# Patient Record
Sex: Female | Born: 1995 | Race: Black or African American | Hispanic: No | Marital: Single
Health system: Southern US, Community
[De-identification: ages and names within clinical notes are randomized; demographics above are authoritative.]

## PROBLEM LIST (undated history)

## (undated) DIAGNOSIS — J45909 Unspecified asthma, uncomplicated: Secondary | ICD-10-CM

## (undated) DIAGNOSIS — F32A Depression, unspecified: Secondary | ICD-10-CM

## (undated) DIAGNOSIS — G4700 Insomnia, unspecified: Secondary | ICD-10-CM

## (undated) DIAGNOSIS — R51 Headache: Secondary | ICD-10-CM

## (undated) DIAGNOSIS — T7840XA Allergy, unspecified, initial encounter: Secondary | ICD-10-CM

## (undated) DIAGNOSIS — F329 Major depressive disorder, single episode, unspecified: Secondary | ICD-10-CM

## (undated) DIAGNOSIS — R519 Headache, unspecified: Secondary | ICD-10-CM

## (undated) HISTORY — PX: WISDOM TOOTH EXTRACTION: SHX21

## (undated) HISTORY — DX: Allergy, unspecified, initial encounter: T78.40XA

## (undated) HISTORY — DX: Unspecified asthma, uncomplicated: J45.909

---
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
## 2016-02-26 ENCOUNTER — Ambulatory Visit (INDEPENDENT_AMBULATORY_CARE_PROVIDER_SITE_OTHER): Payer: Self-pay | Admitting: Orthopaedic Surgery

## 2016-03-27 ENCOUNTER — Ambulatory Visit (INDEPENDENT_AMBULATORY_CARE_PROVIDER_SITE_OTHER): Payer: Medicaid Other | Admitting: Orthopaedic Surgery

## 2016-05-02 ENCOUNTER — Ambulatory Visit (INDEPENDENT_AMBULATORY_CARE_PROVIDER_SITE_OTHER): Payer: Medicaid Other | Admitting: Physician Assistant

## 2016-05-22 ENCOUNTER — Ambulatory Visit (INDEPENDENT_AMBULATORY_CARE_PROVIDER_SITE_OTHER): Payer: Medicaid Other | Admitting: Orthopaedic Surgery

## 2016-06-19 ENCOUNTER — Ambulatory Visit (INDEPENDENT_AMBULATORY_CARE_PROVIDER_SITE_OTHER): Payer: Medicaid Other | Admitting: Orthopaedic Surgery

## 2016-06-24 ENCOUNTER — Encounter (INDEPENDENT_AMBULATORY_CARE_PROVIDER_SITE_OTHER): Payer: Self-pay

## 2016-06-24 ENCOUNTER — Ambulatory Visit (INDEPENDENT_AMBULATORY_CARE_PROVIDER_SITE_OTHER): Payer: Medicaid Other | Admitting: Orthopaedic Surgery

## 2016-06-24 DIAGNOSIS — M5126 Other intervertebral disc displacement, lumbar region: Secondary | ICD-10-CM | POA: Diagnosis not present

## 2016-06-24 NOTE — Progress Notes (Signed)
The patient is actually well known to me. She is a 21 year old who is morbidly obese who we saw last year was an acute herniated disc to the left at L5-S1. She was asked symptomatic at the time and just went to physical therapy. She's been trying to lose weight and still from time to time working on extension exercises. Physical therapist tried traction. She is continuing to have pain in her lower aspect of her lumbar spine but does radiate down her foot. She is not a diabetic.  On examination she has normal motor and sensory function when I examine both right and lower extremities independently. Her reflexes are equal as well. She has a significantly positive straight leg raise though on the the left.  We went over her previous MRI that did show a herniated disc was well contained at L5-S1 it was contacting the S1 nerve.  At this point she does wish to try an intervention such as an epidural steroid injection. We'll see if Dr. Alvester MorinNewton can do this to the left side at L5-S1 and then I will see her back myself in a month and see what this is done for her.

## 2016-06-25 ENCOUNTER — Other Ambulatory Visit (INDEPENDENT_AMBULATORY_CARE_PROVIDER_SITE_OTHER): Payer: Self-pay

## 2016-06-25 DIAGNOSIS — M545 Low back pain: Principal | ICD-10-CM

## 2016-06-25 DIAGNOSIS — G8929 Other chronic pain: Secondary | ICD-10-CM

## 2016-07-03 ENCOUNTER — Telehealth (INDEPENDENT_AMBULATORY_CARE_PROVIDER_SITE_OTHER): Payer: Self-pay | Admitting: Orthopaedic Surgery

## 2016-07-03 ENCOUNTER — Encounter (INDEPENDENT_AMBULATORY_CARE_PROVIDER_SITE_OTHER): Payer: Medicaid Other | Admitting: Physical Medicine and Rehabilitation

## 2016-07-03 NOTE — Telephone Encounter (Signed)
She can have tylenol #3, 1-2 po every 8 hours as needed for pain, #60

## 2016-07-03 NOTE — Telephone Encounter (Signed)
Please advise 

## 2016-07-04 ENCOUNTER — Telehealth (INDEPENDENT_AMBULATORY_CARE_PROVIDER_SITE_OTHER): Payer: Self-pay | Admitting: Orthopaedic Surgery

## 2016-07-04 NOTE — Telephone Encounter (Signed)
Rx called in to pharmacy. 

## 2016-07-10 NOTE — Telephone Encounter (Signed)
Routed call to Dr. Eliberto Ivory assistant.

## 2016-07-16 ENCOUNTER — Encounter (INDEPENDENT_AMBULATORY_CARE_PROVIDER_SITE_OTHER): Payer: Medicaid Other | Admitting: Physical Medicine and Rehabilitation

## 2016-07-22 ENCOUNTER — Ambulatory Visit (INDEPENDENT_AMBULATORY_CARE_PROVIDER_SITE_OTHER): Payer: Medicaid Other | Admitting: Orthopaedic Surgery

## 2016-08-07 ENCOUNTER — Encounter (INDEPENDENT_AMBULATORY_CARE_PROVIDER_SITE_OTHER): Payer: Self-pay | Admitting: Physical Medicine and Rehabilitation

## 2016-08-07 ENCOUNTER — Ambulatory Visit (INDEPENDENT_AMBULATORY_CARE_PROVIDER_SITE_OTHER): Payer: Medicaid Other | Admitting: Physical Medicine and Rehabilitation

## 2016-08-07 ENCOUNTER — Ambulatory Visit (INDEPENDENT_AMBULATORY_CARE_PROVIDER_SITE_OTHER): Payer: Self-pay

## 2016-08-07 VITALS — BP 135/79 | HR 107

## 2016-08-07 DIAGNOSIS — M5416 Radiculopathy, lumbar region: Secondary | ICD-10-CM | POA: Diagnosis not present

## 2016-08-07 DIAGNOSIS — M5116 Intervertebral disc disorders with radiculopathy, lumbar region: Secondary | ICD-10-CM

## 2016-08-07 MED ORDER — LIDOCAINE HCL (PF) 1 % IJ SOLN
0.3300 mL | Freq: Once | INTRAMUSCULAR | Status: AC
  Administered 2016-08-07: 0.3 mL

## 2016-08-07 MED ORDER — METHYLPREDNISOLONE ACETATE 80 MG/ML IJ SUSP
80.0000 mg | Freq: Once | INTRAMUSCULAR | Status: AC
  Administered 2016-08-07: 80 mg

## 2016-08-07 NOTE — Progress Notes (Signed)
Anna Nixon - 21 y.o. female MRN [id number]  Date of birth: Sep 16, 1995  Office Visit Note: Visit Date: 08/07/2016 PCP: [facility] Referred by: [facility]  Subjective: Chief Complaint  Patient presents with  . Lower Back - Pain   HPI: Anna Nixon is a pleasant 21 year old female who comes in today for planned left S1 transforaminal epidural steroid injection for radiculitis and radiculopathy related to L5-S1 left posterior lateral disc herniation. Patient comes in with a (10/10) pain level. Pain is contstant. Pain and numbness when sitting or standing for too long. She is somewhat anxious today with the thought of an injection in her spine. We did discuss this at length and she should do well and did do well as you'll see below. She's had other conservative care without much relief.    ROS Otherwise per HPI.  Assessment & Plan: Visit Diagnoses:  1. Lumbar radiculopathy   2. Radiculopathy due to lumbar intervertebral disc disorder     Plan: Findings:  Left S1 transforaminal epidural steroid injection. Patient did tolerate the procedure very well. She is a morbidly obese but I'll female. She would do well with core strengthening neutral spine as well as weight loss. Typically these disc herniations do resolve over time and they'll be intermittent flareups and we did discuss this with her. This should not be anything that limits her the rest of her life.    Meds & Orders:  Meds ordered this encounter  Medications  . lidocaine (PF) (XYLOCAINE) 1 % injection 0.3 mL  . methylPREDNISolone acetate (DEPO-MEDROL) injection 80 mg    Orders Placed This Encounter  Procedures  . XR C-ARM NO REPORT  . Epidural Steroid injection    Follow-up: Return in about 3 weeks (around 08/28/2016) for Dr. Magnus Ivan.   Procedures: No procedures performed  S1 Lumbosacral Transforaminal Epidural Steroid Injection - Sub-Pedicular Approach with Fluoroscopic Guidance     Patient: Anna Nixon      Date of Birth: 05-Mar-1996 MRN: [id number] PCP: [facility]      Visit Date: 08/07/2016   Universal Protocol:    Date/Time: 03/30/186:44 AM  Consent Given By: the patient  Position:  PRONE  Additional Comments: Vital signs were monitored before and after the procedure. Patient was prepped and draped in the usual sterile fashion. The correct patient, procedure, and site was verified.   Injection Procedure Details:  Procedure Site One Meds Administered:  Meds ordered this encounter  Medications  . lidocaine (PF) (XYLOCAINE) 1 % injection 0.3 mL  . methylPREDNISolone acetate (DEPO-MEDROL) injection 80 mg    Laterality: Left  Location/Site:  S1 Foramen   Needle size: 22 G  Needle type: Spinal  Needle Placement: Transforaminal  Findings:  -Contrast Used: 1 mL iohexol 180 mg iodine/mL   -Comments: Excellent flow of contrast along the nerve and into the epidural space.  Procedure Details: After squaring off the sacral end-plate to get a true AP view, the C-arm was positioned so that the best possible view of the S1 foramen was visualized. The soft tissues overlying this structure were infiltrated with 2-3 ml. of 1% Lidocaine without Epinephrine.    The spinal needle was inserted toward the target using a "trajectory" view along the fluoroscope beam.  Under AP and lateral visualization, the needle was advanced so it did not puncture dura. Biplanar projections were used to confirm position. Aspiration was confirmed to be negative for CSF and/or blood. A 1-2  ml. volume of Isovue-250 was injected and flow of contrast was noted at each level. Radiographs were obtained for documentation purposes.   After attaining the desired flow of contrast documented above, a 0.5 to 1.0 ml test dose of 0.25% Marcaine was injected into each respective transforaminal space.  The patient was observed for 90 seconds post injection.  After no  sensory deficits were reported, and normal lower extremity motor function was noted,   the above injectate was administered so that equal amounts of the injectate were placed at each foramen (level) into the transforaminal epidural space.   Additional Comments:  The patient tolerated the procedure well Dressing: Band-Aid    Post-procedure details: Patient was observed during the procedure. Post-procedure instructions were reviewed.  Patient left the clinic in stable condition.     Clinical History: Lumbar Spine MRI 09/22/2015 FINDINGS: There is no abnormality at L4-5 or above. The discs are normal. The canal and foramina are widely patent. The distal cord and conus are normal with the conus tip at upper L1.   L5-S1: Left posterior lateral disc herniation displaces and compresses the left S1 nerve root. No migration of the disc.   IMPRESSION: Left posterior lateral disc herniation at L5-S1 compressing and displacing the left S1 nerve root.  She reports that she is a non-smoker but has been exposed to tobacco smoke. She has never used smokeless tobacco. No results for input(s): HGBA1C, LABURIC in the last 8760 hours.  Objective:  VS:  HT:    WT:   BMI:     BP:135/79  HR:(!) 107bpm  TEMP: ( )  RESP:98 % Physical Exam  Musculoskeletal:  Patient ambulates without aid. She has good distal strength with dorsiflexion plantarflexion EHL. She has a positive slump test to the left.    Ortho Exam Imaging: No results found.  Past Medical/Family/Surgical/Social History: Medications & Allergies reviewed per EMR There are no active problems to display for this patient.  Past Medical History:  Diagnosis Date  . Allergy   . Asthma    Family History  Problem Relation Age of Onset  . Hyperlipidemia Mother   . Hypertension Mother   . Diabetes Other   . Hypertension Maternal Grandmother   . Hyperlipidemia Maternal Grandmother    Past Surgical History:  Procedure Laterality  Date  . WISDOM TOOTH EXTRACTION     Social History   Occupational History  . Not on file.   Social History Main Topics  . Smoking status: Passive Smoke Exposure - Never Smoker  . Smokeless tobacco: Never Used  . Alcohol use No  . Drug use: No  . Sexual activity: Not on file

## 2016-08-07 NOTE — Patient Instructions (Signed)

## 2016-08-09 NOTE — Procedures (Signed)
S1 Lumbosacral Transforaminal Epidural Steroid Injection - Sub-Pedicular Approach with Fluoroscopic Guidance   Patient: Anna Nixon      Date of Birth: May 19, 1995 MRN: [id number] PCP: [facility]      Visit Date: 08/07/2016   Universal Protocol:    Date/Time: 03/30/186:44 AM  Consent Given By: the patient  Position:  PRONE  Additional Comments: Vital signs were monitored before and after the procedure. Patient was prepped and draped in the usual sterile fashion. The correct patient, procedure, and site was verified.   Injection Procedure Details:  Procedure Site One Meds Administered:  Meds ordered this encounter  Medications  . lidocaine (PF) (XYLOCAINE) 1 % injection 0.3 mL  . methylPREDNISolone acetate (DEPO-MEDROL) injection 80 mg    Laterality: Left  Location/Site:  S1 Foramen   Needle size: 22 G  Needle type: Spinal  Needle Placement: Transforaminal  Findings:  -Contrast Used: 1 mL iohexol 180 mg iodine/mL   -Comments: Excellent flow of contrast along the nerve and into the epidural space.  Procedure Details: After squaring off the sacral end-plate to get a true AP view, the C-arm was positioned so that the best possible view of the S1 foramen was visualized. The soft tissues overlying this structure were infiltrated with 2-3 ml. of 1% Lidocaine without Epinephrine.    The spinal needle was inserted toward the target using a "trajectory" view along the fluoroscope beam.  Under AP and lateral visualization, the needle was advanced so it did not puncture dura. Biplanar projections were used to confirm position. Aspiration was confirmed to be negative for CSF and/or blood. A 1-2 ml. volume of Isovue-250 was injected and flow of contrast was noted at each level. Radiographs were obtained for documentation purposes.   After attaining the desired flow of contrast documented above, a 0.5 to 1.0 ml test dose of 0.25% Marcaine was injected  into each respective transforaminal space.  The patient was observed for 90 seconds post injection.  After no sensory deficits were reported, and normal lower extremity motor function was noted,   the above injectate was administered so that equal amounts of the injectate were placed at each foramen (level) into the transforaminal epidural space.   Additional Comments:  The patient tolerated the procedure well Dressing: Band-Aid    Post-procedure details: Patient was observed during the procedure. Post-procedure instructions were reviewed.  Patient left the clinic in stable condition.

## 2016-08-19 ENCOUNTER — Telehealth (INDEPENDENT_AMBULATORY_CARE_PROVIDER_SITE_OTHER): Payer: Self-pay | Admitting: Orthopaedic Surgery

## 2016-08-19 NOTE — Telephone Encounter (Signed)
Patient's mother called asked if her daughter can be worked in sooner with Dr Magnus Ivan. She said her daughter is in so much pain and want to know what to do. She advised the medication is not working to stop the pain. The number to contact Unk Pinto is [phone]

## 2016-08-20 NOTE — Telephone Encounter (Signed)
Patient rescheduled 08/22/16 at 1:00 pm with Suszanne Finch mother of appointment

## 2016-08-20 NOTE — Telephone Encounter (Signed)
We could maybe work her in with [facility] Thursday? But it will be a Workin, so she will wait

## 2016-08-22 ENCOUNTER — Ambulatory Visit (INDEPENDENT_AMBULATORY_CARE_PROVIDER_SITE_OTHER): Payer: Medicaid Other | Admitting: Physician Assistant

## 2016-08-22 DIAGNOSIS — M5416 Radiculopathy, lumbar region: Secondary | ICD-10-CM

## 2016-08-22 MED ORDER — DICLOFENAC SODIUM 75 MG PO TBEC: 75.0000 mg | DELAYED_RELEASE_TABLET | Freq: Two times a day (BID) | ORAL | 1 refills | Status: DC

## 2016-08-22 NOTE — Progress Notes (Signed)
   Office Visit Note   Patient: Anna Nixon           Date of Birth: 1995-11-27           MRN: [id number] Visit Date: 08/22/2016              Requested by: [facility] [address] [place], [place] [address] PCP: [facility]   Assessment & Plan: Visit Diagnoses:  1. Radiculopathy, lumbar region     Plan: We will have her stop her naproxen. Place her on diclofenac 75 mg twice a day. We'll refer her to neurosurgery for evaluation and treatment. She'll follow with [place] on when necessary basis.  Follow-Up Instructions: Return if symptoms worsen or fail to improve.   Orders:  No orders of the defined types were placed in this encounter.  Meds ordered this encounter  Medications  . diclofenac (VOLTAREN) 75 MG EC tablet    Sig: Take 1 tablet (75 mg total) by mouth 2 (two) times daily.    Dispense:  60 tablet    Refill:  1      Procedures: No procedures performed   Clinical Data: No additional findings.   Subjective: No chief complaint on file.   HPI Anna Nixon turns today for follow-up of her low back pain with radicular symptoms left leg status post S1 lumbosacral transforaminal epidural steroid injection by Dr. Alvester Morin. She states the injection was somewhat helpful but she still a lot of pain. She's unable to sit or stand for any period of time due to the pain. She continues to have radicular symptoms down the left leg. Review of Systems   Objective: Vital Signs: There were no vitals taken for this visit.  Physical Exam  Constitutional: She is oriented to person, place, and time. She appears well-developed and well-nourished. She appears distressed.  Pulmonary/Chest: Effort normal.  Neurological: She is alert and oriented to person, place, and time.  Psychiatric: She has a normal mood and affect.    Ortho Exam  Positive straight leg raise on the left negative on the right. Walks with an antalgic gait  without any assistive device. Specialty Comments:  No specialty comments available.  Imaging: No results found.   PMFS History: There are no active problems to display for this patient.  Past Medical History:  Diagnosis Date  . Allergy   . Asthma     Family History  Problem Relation Age of Onset  . Hyperlipidemia Mother   . Hypertension Mother   . Diabetes Other   . Hypertension Maternal Grandmother   . Hyperlipidemia Maternal Grandmother     Past Surgical History:  Procedure Laterality Date  . WISDOM TOOTH EXTRACTION     Social History   Occupational History  . Not on file.   Social History Main Topics  . Smoking status: Passive Smoke Exposure - Never Smoker  . Smokeless tobacco: Never Used  . Alcohol use No  . Drug use: No  . Sexual activity: Not on file

## 2016-08-26 ENCOUNTER — Ambulatory Visit (INDEPENDENT_AMBULATORY_CARE_PROVIDER_SITE_OTHER): Payer: Medicaid Other | Admitting: Orthopaedic Surgery

## 2016-10-30 ENCOUNTER — Other Ambulatory Visit: Payer: Self-pay | Admitting: Physician Assistant

## 2016-10-30 DIAGNOSIS — M5126 Other intervertebral disc displacement, lumbar region: Secondary | ICD-10-CM

## 2016-11-05 ENCOUNTER — Emergency Department (HOSPITAL_COMMUNITY): Payer: Medicaid Other

## 2016-11-05 ENCOUNTER — Emergency Department (HOSPITAL_COMMUNITY)
Admission: EM | Admit: 2016-11-05 | Discharge: 2016-11-05 | Disposition: A | Discharge status: Home / Self Care | Payer: Medicaid Other | Attending: Emergency Medicine | Admitting: Emergency Medicine

## 2016-11-05 ENCOUNTER — Encounter (HOSPITAL_COMMUNITY): Payer: Self-pay | Admitting: Emergency Medicine

## 2016-11-05 DIAGNOSIS — Z7722 Contact with and (suspected) exposure to environmental tobacco smoke (acute) (chronic): Secondary | ICD-10-CM | POA: Insufficient documentation

## 2016-11-05 DIAGNOSIS — M5432 Sciatica, left side: Secondary | ICD-10-CM | POA: Diagnosis present

## 2016-11-05 DIAGNOSIS — J45909 Unspecified asthma, uncomplicated: Secondary | ICD-10-CM | POA: Diagnosis not present

## 2016-11-05 MED ORDER — KETOROLAC TROMETHAMINE 60 MG/2ML IM SOLN
60.0000 mg | Freq: Once | INTRAMUSCULAR | Status: AC
  Administered 2016-11-05: 60 mg via INTRAMUSCULAR
  Filled 2016-11-05: qty 2

## 2016-11-05 MED ORDER — BACLOFEN 10 MG PO TABS: 10.0000 mg | ORAL_TABLET | Freq: Three times a day (TID) | ORAL | 0 refills | Status: DC

## 2016-11-05 MED ORDER — PREDNISONE 20 MG PO TABS: 40.0000 mg | ORAL_TABLET | Freq: Every day | ORAL | 0 refills | Status: DC

## 2016-11-05 MED ORDER — MELOXICAM 15 MG PO TABS: 15.0000 mg | ORAL_TABLET | Freq: Every day | ORAL | 0 refills | Status: DC

## 2016-11-05 MED ORDER — DEXAMETHASONE SODIUM PHOSPHATE 10 MG/ML IJ SOLN
10.0000 mg | Freq: Once | INTRAMUSCULAR | Status: AC
  Administered 2016-11-05: 10 mg via INTRAMUSCULAR
  Filled 2016-11-05: qty 1

## 2016-11-05 NOTE — ED Notes (Signed)
Pt ambulatory and independent at discharge.  Verbalized understanding of discharge instructions 

## 2016-11-05 NOTE — ED Provider Notes (Signed)
[facility]-EMERGENCY DEPT Provider Note   CSN: [id number] Arrival date & time: 11/05/16  1250     History   Chief Complaint Chief Complaint  Patient presents with  . Back Pain    HPI Anna Nixon is a 21 y.o. female who presents emergency Department with chief complaint of left-sided sciatica. She has a possible history of obesity and confirmed left-sided S1 compression on MRI. She was seen previously by Dr. Magnus Ivan and had a direct steroid injection by Dr. Alvester Morin at his office. She had minimal relief of her symptoms and has had continued severe pain for  Several months. She states that her pain is currently 10 out of 10. She has been trying to rest and has had no relief. She states that no movement seemed to make it any worse or better. She is constant, severe pain radiating down the left leg. She denies any new numbness, weakness, saddle anesthesia or urinary or fecal incontinence. She states that she was given steroid medication previously by Dr. Magnus Ivan and that seemed to help her the most..  HPI  Past Medical History:  Diagnosis Date  . Allergy   . Asthma     There are no active problems to display for this patient.   Past Surgical History:  Procedure Laterality Date  . WISDOM TOOTH EXTRACTION      OB History    No data available       Home Medications    Prior to Admission medications   Medication Sig Start Date End Date Taking? Authorizing Provider  cloNIDine (CATAPRES) 0.1 MG tablet Take 0.1 mg by mouth 2 (two) times daily.    [provider]  diclofenac (VOLTAREN) 75 MG EC tablet Take 1 tablet (75 mg total) by mouth 2 (two) times daily. 08/22/16   Kirtland Bouchard, PA-C  methocarbamol (ROBAXIN) 500 MG tablet Take 1 tablet (500 mg total) by mouth 4 (four) times daily. 05/18/15   Renne Crigler, PA-C    Family History Family History  Problem Relation Age of Onset  . Hyperlipidemia Mother   . Hypertension Mother   . Diabetes Other   . Hypertension  Maternal Grandmother   . Hyperlipidemia Maternal Grandmother     Social History Social History  Substance Use Topics  . Smoking status: Passive Smoke Exposure - Never Smoker  . Smokeless tobacco: Never Used  . Alcohol use No     Allergies   Patient has no known allergies.   Review of Systems Review of Systems  Ten systems reviewed and are negative for acute change, except as noted in the HPI.   Physical Exam Updated Vital Signs BP (!) 169/139   Pulse 100   Temp 99.3 F (37.4 C) (Oral)   Resp 20   Wt (!) 151 kg (333 lb)   LMP 10/29/2016   SpO2 100%   BMI 50.63 kg/m   Physical Exam  Constitutional: She is oriented to person, place, and time. She appears well-developed and well-nourished. No distress.  Patient unable to lie still in the bed twisting and turning. She appears uncomfortable  HENT:  Head: Normocephalic and atraumatic.  Eyes: Conjunctivae are normal. No scleral icterus.  Neck: Normal range of motion.  Cardiovascular: Normal rate, regular rhythm and normal heart sounds.  Exam reveals no gallop and no friction rub.   No murmur heard. Pulmonary/Chest: Effort normal and breath sounds normal. No respiratory distress.  Abdominal: Soft. Bowel sounds are normal. She exhibits no distension and no mass. There is  no tenderness. There is no guarding.  Musculoskeletal:  Patient appears to be in mild to moderate pain, antalgic gait noted. Lumbosacral spine area reveals no local tenderness or mass. Painful and reduced LS ROM noted. Straight leg raise is positive at 10 degrees on left. DTR's, motor strength and sensation normal, including heel and toe gait.  Peripheral pulses are palpable.   Neurological: She is alert and oriented to person, place, and time.  Skin: Skin is warm and dry. She is not diaphoretic.  Psychiatric: Her behavior is normal.  Nursing note and vitals reviewed.    ED Treatments / Results  Labs (all labs ordered are listed, but only abnormal  results are displayed) Labs Reviewed - No data to display  EKG  EKG Interpretation None       Radiology Dg Chest 2 View  Result Date: 11/05/2016 CLINICAL DATA:  Mid back pain for several days, history of asthma EXAM: CHEST  2 VIEW COMPARISON:  Chest x-ray of 06/12/2015 FINDINGS: No active infiltrate or effusion is seen. Mediastinal and hilar contours are unremarkable. Heart is within normal limits in size. No bony abnormality is noted. IMPRESSION: No active cardiopulmonary disease. Electronically Signed   By: Dwyane DeePaul  Barry M.D.   On: 11/05/2016 14:32    Procedures Procedures (including critical care time)  Medications Ordered in ED Medications - No data to display   Initial Impression / Assessment and Plan / ED Course  I have reviewed the triage vital signs and the nursing notes.  Pertinent labs & imaging results that were available during my care of the patient were reviewed by me and considered in my medical decision making (see chart for details).  Clinical Course as of Nov 06 1751  Tue Nov 05, 2016  1659 Patient appears to be in pain. She appears to be wearing an inappropriately sized. BP: (!) 169/139 [AH]    Clinical Course User Index [AH] Arthor CaptainHarris, Haydin Calandra, PA-C   Patient with back pain.  No neurological deficits and normal neuro exam.  Patient can walk but states is painful.  No loss of bowel or bladder control.  No concern for cauda equina.  No fever, night sweats, weight loss, h/o cancer, IVDU.  RICE protocol and pain medicine indicated and discussed with patient.    Final Clinical Impressions(s) / ED Diagnoses   Final diagnoses:  Sciatica of left side    New Prescriptions New Prescriptions   No medications on file     Arthor CaptainHarris, Donnajean Chesnut, PA-C 11/05/16 1753    Loren RacerYelverton, David, MD 11/08/16 1640

## 2016-11-05 NOTE — ED Triage Notes (Signed)
Per patient, states mid back pain that started a few days ago-states it hurt to breath-states she has a herniated disc

## 2016-11-05 NOTE — Discharge Instructions (Signed)
SEEK IMMEDIATE MEDICAL ATTENTION IF: New numbness, tingling, weakness, or problem with the use of your arms or legs.  Severe back pain not relieved with medications.  Change in bowel or bladder control.  Increasing pain in any areas of the body (such as chest or abdominal pain).  Shortness of breath, dizziness or fainting.  Nausea (feeling sick to your stomach), vomiting, fever, or sweats.  

## 2016-11-05 NOTE — ED Notes (Signed)
ED Provider at bedside. 

## 2016-11-21 ENCOUNTER — Ambulatory Visit
Admission: RE | Admit: 2016-11-21 | Discharge: 2016-11-21 | Disposition: A | Discharge status: Home / Self Care | Payer: Medicaid Other | Source: Ambulatory Visit | Attending: Physician Assistant | Admitting: Physician Assistant

## 2016-11-21 DIAGNOSIS — M5126 Other intervertebral disc displacement, lumbar region: Secondary | ICD-10-CM

## 2017-03-26 ENCOUNTER — Other Ambulatory Visit: Payer: Self-pay | Admitting: Neurosurgery

## 2017-03-31 ENCOUNTER — Encounter (HOSPITAL_COMMUNITY): Payer: Self-pay

## 2017-03-31 ENCOUNTER — Encounter (HOSPITAL_COMMUNITY): Payer: Self-pay | Admitting: Emergency Medicine

## 2017-03-31 ENCOUNTER — Other Ambulatory Visit: Payer: Self-pay

## 2017-03-31 ENCOUNTER — Encounter (HOSPITAL_COMMUNITY)
Admission: RE | Admit: 2017-03-31 | Discharge: 2017-03-31 | Disposition: A | Discharge status: Home / Self Care | Payer: Medicaid Other | Source: Ambulatory Visit | Attending: Neurosurgery | Admitting: Neurosurgery

## 2017-03-31 DIAGNOSIS — M5432 Sciatica, left side: Secondary | ICD-10-CM | POA: Insufficient documentation

## 2017-03-31 DIAGNOSIS — Z01818 Encounter for other preprocedural examination: Secondary | ICD-10-CM | POA: Insufficient documentation

## 2017-03-31 HISTORY — DX: Headache, unspecified: R51.9

## 2017-03-31 HISTORY — DX: Insomnia, unspecified: G47.00

## 2017-03-31 HISTORY — DX: Headache: R51

## 2017-03-31 LAB — CBC
HEMATOCRIT: 36.3 % (ref 36.0–46.0)
HEMOGLOBIN: 11.6 g/dL — AB (ref 12.0–15.0)
MCH: 22.5 pg — AB (ref 26.0–34.0)
MCHC: 32 g/dL (ref 30.0–36.0)
MCV: 70.5 fL — ABNORMAL LOW (ref 78.0–100.0)
Platelets: 661 10*3/uL — ABNORMAL HIGH (ref 150–400)
RBC: 5.15 MIL/uL — AB (ref 3.87–5.11)
RDW: 17.7 % — ABNORMAL HIGH (ref 11.5–15.5)
WBC: 10.3 10*3/uL (ref 4.0–10.5)

## 2017-03-31 LAB — BASIC METABOLIC PANEL
ANION GAP: 8 (ref 5–15)
BUN: 7 mg/dL (ref 6–20)
CALCIUM: 9 mg/dL (ref 8.9–10.3)
CO2: 24 mmol/L (ref 22–32)
Chloride: 106 mmol/L (ref 101–111)
Creatinine, Ser: 0.53 mg/dL (ref 0.44–1.00)
GFR calc non Af Amer: >60 mL/min (ref >60)
GLUCOSE: 105 mg/dL — AB (ref 65–99)
POTASSIUM: 3.7 mmol/L (ref 3.5–5.1)
Sodium: 138 mmol/L (ref 135–145)

## 2017-03-31 LAB — HCG, SERUM, QUALITATIVE: PREG SERUM: NEGATIVE

## 2017-03-31 NOTE — Progress Notes (Signed)
PCP - pt denies having a PCP Cardiologist - pt denies cardiac hx or cardiologist, takes clonidine at night for insomnia  Patient denies shortness of breath, fever, cough and chest pain at PAT appointment  Patient verbalized understanding of instructions that were given to them at the PAT appointment. Patient was also instructed that they will need to review over the PAT instructions again at home before surgery.

## 2017-03-31 NOTE — Pre-Procedure Instructions (Signed)
Anna Nixon  03/31/2017      [id number] [address], [place] - [phone] [facility] [address] [place] [id number] Phone: [phone] Fax: [phone]    Your procedure is scheduled on Tuesday Novmber 27.  [facility].M[facility]  Call this number if you have problems the morning of surgery:  [phone]   Remember:  Do not eat food or drink liquids after midnight.  Take these medicines the morning of surgery with A SIP OF WATER:   Albuterol inhaler Alprazolam (Xanax) beclomethasone (QVAR) Bupropion (wellbutrin) Cetirizine (zyrtec) Clonidine (catapres)  7 days prior to surgery STOP taking any Aspirin(unless otherwise instructed by your surgeon), Aleve, Naproxen, Ibuprofen, Motrin, Advil, Goody's, BC's, all herbal medications, fish oil, and all vitamins    Do not wear jewelry, make-up or nail polish.  Do not wear lotions, powders, or perfumes, or deoderant.  Do not shave 48 hours prior to surgery.  Men may shave face and neck.  Do not bring valuables to the hospital.  [facility] is not responsible for any belongings or valuables.  Contacts, dentures or bridgework may not be worn into surgery.  Leave your suitcase in the car.  After surgery it may be brought to your room.  For patients admitted to the hospital, discharge time will be determined by your treatment team.  Patients discharged the day of surgery will not be allowed to drive home.   Special instructions:    [facility]- Preparing For Surgery  Before surgery, you can play an important role. Because skin is not sterile, your skin needs to be as free of germs as possible. You can reduce the number of germs on your skin by washing with CHG (chlorahexidine gluconate) Soap before surgery.  CHG is an antiseptic cleaner which kills germs and bonds with the skin to continue killing germs even after washing.  Please do not use  if you have an allergy to CHG or antibacterial soaps. If your skin becomes reddened/irritated stop using the CHG.  Do not shave (including legs and underarms) for at least 48 hours prior to first CHG shower. It is OK to shave your face.  Please follow these instructions carefully.   1. Shower the NIGHT BEFORE SURGERY and the MORNING OF SURGERY with CHG.   2. If you chose to wash your hair, wash your hair first as usual with your normal shampoo.  3. After you shampoo, rinse your hair and body thoroughly to remove the shampoo.  4. Use CHG as you would any other liquid soap. You can apply CHG directly to the skin and wash gently with a scrungie or a clean washcloth.   5. Apply the CHG Soap to your body ONLY FROM THE NECK DOWN.  Do not use on open wounds or open sores. Avoid contact with your eyes, ears, mouth and genitals (private parts). Wash Face and genitals (private parts)  with your normal soap.  6. Wash thoroughly, paying special attention to the area where your surgery will be performed.  7. Thoroughly rinse your body with warm water from the neck down.  8. DO NOT shower/wash with your normal soap after using and rinsing off the CHG Soap.  9. Pat yourself dry with a CLEAN TOWEL.  10. Wear CLEAN PAJAMAS to bed the night before surgery, wear comfortable clothes the morning of surgery  11. Place CLEAN SHEETS on your bed the night of  your first shower and DO NOT SLEEP WITH PETS.    Day of Surgery: Do not apply any deodorants/lotions. Please wear clean clothes to the hospital/surgery center.      Please read over the following fact sheets that you were given. Coughing and Deep Breathing, MRSA Information and Surgical Site Infection Prevention

## 2017-04-02 LAB — NASOPHARYNGEAL CULTURE: Culture: NORMAL

## 2017-04-08 ENCOUNTER — Encounter (HOSPITAL_COMMUNITY): Admission: RE | Payer: Self-pay | Source: Ambulatory Visit

## 2017-04-08 ENCOUNTER — Ambulatory Visit (HOSPITAL_COMMUNITY): Admission: RE | Admit: 2017-04-08 | Payer: Medicaid Other | Source: Ambulatory Visit | Admitting: Neurosurgery

## 2017-04-08 SURGERY — LUMBAR LAMINECTOMY/DECOMPRESSION MICRODISCECTOMY 1 LEVEL
Anesthesia: General | Laterality: Left

## 2017-04-24 ENCOUNTER — Other Ambulatory Visit: Payer: Self-pay | Admitting: Neurosurgery

## 2017-05-05 ENCOUNTER — Other Ambulatory Visit (HOSPITAL_COMMUNITY): Payer: Medicaid Other

## 2017-05-08 NOTE — Progress Notes (Addendum)
PCP:  Cardiologist:  EKG:  Stress test:  ECHO:  Cardiac Cath:  Chest x-ray 

## 2017-05-08 NOTE — Pre-Procedure Instructions (Signed)
Anna Nixon  05/08/2017      [id number] [address]ette Otto- [place], [place] - [id number] [facility] [address] [place] [address] Phone: [phone] Fax: [phone]    Your procedure is scheduled on May 15, 2017.  [facility].  Call this number if you have problems the morning of surgery:  [phone]   Remember:  Do not eat food or drink liquids after midnight.  Take these medicines the morning of surgery with A SIP OF WATER QVAR inhaler, bupropion (wellbutrin).  7 days prior to surgery STOP taking any Aspirin (unless otherwise instructed by your surgeon), Aleve, Naproxen, Ibuprofen, Motrin, Advil, Goody's, BC's, all herbal medications, fish oil, and all vitamins  Continue all other medications as instructed by your physician except follow the above medication instructions before surgery   Do not wear jewelry, make-up or nail polish.  Do not wear lotions, powders, or perfumes, or deodorant.  Do not shave 48 hours prior to surgery.  Men may shave face and neck.  Do not bring valuables to the hospital.  [facility] is not responsible for any belongings or valuables.  Contacts, dentures or bridgework may not be worn into surgery.  Leave your suitcase in the car.  After surgery it may be brought to your room.  For patients admitted to the hospital, discharge time will be determined by your treatment team.  Patients discharged the day of surgery will not be allowed to drive home.   Special instructions:  [name]  Before surgery, you can play an important role. Because skin is not sterile, your skin needs to be as free of germs as possible. You can reduce the number of germs on your skin by washing with CHG (chlorahexidine gluconate) Soap before surgery.  CHG is an antiseptic cleaner which kills germs and bonds with the skin to continue killing germs even after  washing.  Please do not use if you have an allergy to CHG or antibacterial soaps. If your skin becomes reddened/irritated stop using the CHG.  Do not shave (including legs and underarms) for at least 48 hours prior to first CHG shower. It is OK to shave your face.  Please follow these instructions carefully.   1. Shower the NIGHT BEFORE SURGERY and the MORNING OF SURGERY with CHG.   2. If you chose to wash your hair, wash your hair first as usual with your normal shampoo.  3. After you shampoo, rinse your hair and body thoroughly to remove the shampoo.  4. Use CHG as you would any other liquid soap. You can apply CHG directly to the skin and wash gently with a scrungie or a clean washcloth.   5. Apply the CHG Soap to your body ONLY FROM THE NECK DOWN.  Do not use on open wounds or open sores. Avoid contact with your eyes, ears, mouth and genitals (private parts). Wash Face and genitals (private parts)  with your normal soap.  6. Wash thoroughly, paying special attention to the area where your surgery will be performed.  7. Thoroughly rinse your body with warm water from the neck down.  8. DO NOT shower/wash with your normal soap after using and rinsing off the CHG Soap.  9. Pat yourself dry with a CLEAN TOWEL.  10. Wear CLEAN PAJAMAS to bed the night before surgery, wear comfortable clothes the morning of surgery  11. Place CLEAN SHEETS  on your bed the night of your first shower and DO NOT SLEEP WITH PETS.  Day of Surgery: Do not apply any deodorants/lotions. Please wear clean clothes to the hospital/surgery center.    Please read over the following fact sheets that you were given. Pain Booklet, Coughing and Deep Breathing, MRSA Information and Surgical Site Infection Prevention

## 2017-05-09 ENCOUNTER — Inpatient Hospital Stay (HOSPITAL_COMMUNITY)
Admission: RE | Admit: 2017-05-09 | Discharge: 2017-05-09 | Disposition: A | Discharge status: Home / Self Care | Payer: Medicaid Other | Source: Ambulatory Visit

## 2017-05-12 ENCOUNTER — Encounter (HOSPITAL_COMMUNITY): Payer: Self-pay

## 2017-05-12 ENCOUNTER — Other Ambulatory Visit: Payer: Self-pay

## 2017-05-12 ENCOUNTER — Encounter (HOSPITAL_COMMUNITY)
Admission: RE | Admit: 2017-05-12 | Discharge: 2017-05-12 | Disposition: A | Discharge status: Home / Self Care | Payer: Medicaid Other | Source: Ambulatory Visit | Attending: Neurosurgery | Admitting: Neurosurgery

## 2017-05-12 DIAGNOSIS — Z01812 Encounter for preprocedural laboratory examination: Secondary | ICD-10-CM | POA: Diagnosis present

## 2017-05-12 HISTORY — DX: Major depressive disorder, single episode, unspecified: F32.9

## 2017-05-12 HISTORY — DX: Depression, unspecified: F32.A

## 2017-05-12 LAB — CBC
HCT: 35.1 % — ABNORMAL LOW (ref 36.0–46.0)
Hemoglobin: 11.3 g/dL — ABNORMAL LOW (ref 12.0–15.0)
MCH: 22.9 pg — AB (ref 26.0–34.0)
MCHC: 32.2 g/dL (ref 30.0–36.0)
MCV: 71.1 fL — AB (ref 78.0–100.0)
PLATELETS: 608 10*3/uL — AB (ref 150–400)
RBC: 4.94 MIL/uL (ref 3.87–5.11)
RDW: 18.4 % — AB (ref 11.5–15.5)
WBC: 10.8 10*3/uL — AB (ref 4.0–10.5)

## 2017-05-12 LAB — HCG, SERUM, QUALITATIVE: Preg, Serum: NEGATIVE

## 2017-05-12 NOTE — Progress Notes (Signed)
PCP is [facility] Denies ever seeing a cardiologist.  Denies ever having a card cath, stress test, or echo. Denies chest pain, fever, or cough.

## 2017-05-14 LAB — NASOPHARYNGEAL CULTURE: CULTURE: NORMAL

## 2017-05-14 MED ORDER — DEXTROSE 5 % IV SOLN
3.0000 g | INTRAVENOUS | Status: AC
  Administered 2017-05-15: 3 g via INTRAVENOUS
  Filled 2017-05-14: qty 3

## 2017-05-15 ENCOUNTER — Encounter (HOSPITAL_COMMUNITY)
Admission: RE | Disposition: A | Discharge status: Home / Self Care | Payer: Self-pay | Source: Ambulatory Visit | Attending: Neurosurgery

## 2017-05-15 ENCOUNTER — Ambulatory Visit (HOSPITAL_COMMUNITY): Payer: Medicaid Other | Admitting: Anesthesiology

## 2017-05-15 ENCOUNTER — Observation Stay (HOSPITAL_COMMUNITY)
Admission: RE | Admit: 2017-05-15 | Discharge: 2017-05-15 | Disposition: A | Discharge status: Home / Self Care | Payer: Medicaid Other | Source: Ambulatory Visit | Attending: Neurosurgery | Admitting: Neurosurgery

## 2017-05-15 ENCOUNTER — Other Ambulatory Visit: Payer: Self-pay

## 2017-05-15 ENCOUNTER — Encounter (HOSPITAL_COMMUNITY): Payer: Self-pay | Admitting: Anesthesiology

## 2017-05-15 ENCOUNTER — Ambulatory Visit (HOSPITAL_COMMUNITY): Payer: Medicaid Other

## 2017-05-15 DIAGNOSIS — M5126 Other intervertebral disc displacement, lumbar region: Secondary | ICD-10-CM | POA: Diagnosis present

## 2017-05-15 DIAGNOSIS — Z79899 Other long term (current) drug therapy: Secondary | ICD-10-CM | POA: Diagnosis not present

## 2017-05-15 DIAGNOSIS — F329 Major depressive disorder, single episode, unspecified: Secondary | ICD-10-CM | POA: Insufficient documentation

## 2017-05-15 DIAGNOSIS — Z419 Encounter for procedure for purposes other than remedying health state, unspecified: Secondary | ICD-10-CM

## 2017-05-15 DIAGNOSIS — Z6841 Body Mass Index (BMI) 40.0 and over, adult: Secondary | ICD-10-CM | POA: Diagnosis not present

## 2017-05-15 DIAGNOSIS — G4700 Insomnia, unspecified: Secondary | ICD-10-CM | POA: Diagnosis not present

## 2017-05-15 DIAGNOSIS — M5116 Intervertebral disc disorders with radiculopathy, lumbar region: Secondary | ICD-10-CM | POA: Diagnosis not present

## 2017-05-15 HISTORY — PX: LUMBAR LAMINECTOMY/DECOMPRESSION MICRODISCECTOMY: SHX5026

## 2017-05-15 SURGERY — LUMBAR LAMINECTOMY/DECOMPRESSION MICRODISCECTOMY 1 LEVEL
Anesthesia: General | Site: Back

## 2017-05-15 MED ORDER — HYDROMORPHONE HCL 1 MG/ML IJ SOLN: 1.0000 mg | INTRAMUSCULAR | Status: DC | PRN

## 2017-05-15 MED ORDER — LIDOCAINE-EPINEPHRINE 1 %-1:100000 IJ SOLN
INTRAMUSCULAR | Status: DC | PRN
  Administered 2017-05-15: 3 mL

## 2017-05-15 MED ORDER — NEOSTIGMINE METHYLSULFATE 5 MG/5ML IV SOSY
PREFILLED_SYRINGE | INTRAVENOUS | Status: AC
  Filled 2017-05-15: qty 5

## 2017-05-15 MED ORDER — SODIUM CHLORIDE 0.9% FLUSH: 3.0000 mL | Freq: Two times a day (BID) | INTRAVENOUS | Status: DC

## 2017-05-15 MED ORDER — PANTOPRAZOLE SODIUM 40 MG IV SOLR: 40.0000 mg | Freq: Every day | INTRAVENOUS | Status: DC

## 2017-05-15 MED ORDER — METHOCARBAMOL 1000 MG/10ML IJ SOLN
500.0000 mg | Freq: Four times a day (QID) | INTRAVENOUS | Status: DC | PRN
  Filled 2017-05-15: qty 5

## 2017-05-15 MED ORDER — ONDANSETRON HCL 4 MG/2ML IJ SOLN
INTRAMUSCULAR | Status: AC
  Filled 2017-05-15: qty 2

## 2017-05-15 MED ORDER — OXYCODONE HCL 10 MG PO TABS: 10.0000 mg | ORAL_TABLET | ORAL | 0 refills | Status: AC | PRN

## 2017-05-15 MED ORDER — MIDAZOLAM HCL 2 MG/2ML IJ SOLN
INTRAMUSCULAR | Status: AC
  Filled 2017-05-15: qty 2

## 2017-05-15 MED ORDER — FENTANYL CITRATE (PF) 100 MCG/2ML IJ SOLN
INTRAMUSCULAR | Status: DC | PRN
  Administered 2017-05-15: 150 ug via INTRAVENOUS
  Administered 2017-05-15: 100 ug via INTRAVENOUS

## 2017-05-15 MED ORDER — ACETAMINOPHEN 325 MG PO TABS: 650.0000 mg | ORAL_TABLET | ORAL | Status: DC | PRN

## 2017-05-15 MED ORDER — METHOCARBAMOL 500 MG PO TABS
ORAL_TABLET | ORAL | Status: AC
  Filled 2017-05-15: qty 1

## 2017-05-15 MED ORDER — METHOCARBAMOL 1000 MG/10ML IJ SOLN: 500.0000 mg | Freq: Four times a day (QID) | INTRAVENOUS | Status: DC | PRN

## 2017-05-15 MED ORDER — SUCCINYLCHOLINE CHLORIDE 20 MG/ML IJ SOLN
INTRAMUSCULAR | Status: DC | PRN
  Administered 2017-05-15: 120 mg via INTRAVENOUS

## 2017-05-15 MED ORDER — OXYCODONE HCL 5 MG/5ML PO SOLN: 5.0000 mg | Freq: Once | ORAL | Status: DC | PRN

## 2017-05-15 MED ORDER — LACTATED RINGERS IV SOLN
INTRAVENOUS | Status: DC
  Administered 2017-05-15 (×2): via INTRAVENOUS

## 2017-05-15 MED ORDER — ZOLPIDEM TARTRATE 5 MG PO TABS: 5.0000 mg | ORAL_TABLET | Freq: Every evening | ORAL | Status: DC | PRN

## 2017-05-15 MED ORDER — SODIUM CHLORIDE 0.9 % IV SOLN: INTRAVENOUS | Status: DC

## 2017-05-15 MED ORDER — OXYCODONE HCL 5 MG PO TABS: 10.0000 mg | ORAL_TABLET | ORAL | Status: DC | PRN

## 2017-05-15 MED ORDER — BUPROPION HCL ER (XL) 300 MG PO TB24
300.0000 mg | ORAL_TABLET | Freq: Every day | ORAL | Status: DC
  Administered 2017-05-15: 300 mg via ORAL
  Filled 2017-05-15: qty 1

## 2017-05-15 MED ORDER — CHLORHEXIDINE GLUCONATE CLOTH 2 % EX PADS: 6.0000 | MEDICATED_PAD | Freq: Once | CUTANEOUS | Status: DC

## 2017-05-15 MED ORDER — SODIUM CHLORIDE 0.9 % IR SOLN
Status: DC | PRN
  Administered 2017-05-15: 500 mL

## 2017-05-15 MED ORDER — LIDOCAINE 2% (20 MG/ML) 5 ML SYRINGE
INTRAMUSCULAR | Status: AC
  Filled 2017-05-15: qty 5

## 2017-05-15 MED ORDER — METHOCARBAMOL 500 MG PO TABS: 500.0000 mg | ORAL_TABLET | Freq: Three times a day (TID) | ORAL | 0 refills | Status: AC | PRN

## 2017-05-15 MED ORDER — PHENOL 1.4 % MT LIQD: 1.0000 | OROMUCOSAL | Status: DC | PRN

## 2017-05-15 MED ORDER — ACETAMINOPHEN 650 MG RE SUPP: 650.0000 mg | RECTAL | Status: DC | PRN

## 2017-05-15 MED ORDER — OXYCODONE HCL 5 MG PO TABS: 5.0000 mg | ORAL_TABLET | Freq: Once | ORAL | Status: DC | PRN

## 2017-05-15 MED ORDER — ESMOLOL HCL 100 MG/10ML IV SOLN
INTRAVENOUS | Status: DC | PRN
  Administered 2017-05-15 (×2): 20 mg via INTRAVENOUS

## 2017-05-15 MED ORDER — METHYLPREDNISOLONE ACETATE 80 MG/ML IJ SUSP
INTRAMUSCULAR | Status: AC
  Filled 2017-05-15: qty 1

## 2017-05-15 MED ORDER — METHYLPREDNISOLONE ACETATE 80 MG/ML IJ SUSP
INTRAMUSCULAR | Status: DC | PRN
  Administered 2017-05-15: 80 mg

## 2017-05-15 MED ORDER — MENTHOL 3 MG MT LOZG: 1.0000 | LOZENGE | OROMUCOSAL | Status: DC | PRN

## 2017-05-15 MED ORDER — LIDOCAINE-EPINEPHRINE 1 %-1:100000 IJ SOLN
INTRAMUSCULAR | Status: AC
  Filled 2017-05-15: qty 1

## 2017-05-15 MED ORDER — THROMBIN (RECOMBINANT) 5000 UNITS EX SOLR
CUTANEOUS | Status: AC
  Filled 2017-05-15: qty 10000

## 2017-05-15 MED ORDER — ROCURONIUM BROMIDE 100 MG/10ML IV SOLN
INTRAVENOUS | Status: DC | PRN
  Administered 2017-05-15: 20 mg via INTRAVENOUS
  Administered 2017-05-15: 60 mg via INTRAVENOUS

## 2017-05-15 MED ORDER — FENTANYL CITRATE (PF) 250 MCG/5ML IJ SOLN
INTRAMUSCULAR | Status: AC
  Filled 2017-05-15: qty 5

## 2017-05-15 MED ORDER — DOCUSATE SODIUM 100 MG PO CAPS: 100.0000 mg | ORAL_CAPSULE | Freq: Two times a day (BID) | ORAL | Status: DC

## 2017-05-15 MED ORDER — SODIUM CHLORIDE 0.9 % IV SOLN: 250.0000 mL | INTRAVENOUS | Status: DC

## 2017-05-15 MED ORDER — PROPOFOL 10 MG/ML IV BOLUS
INTRAVENOUS | Status: AC
  Filled 2017-05-15: qty 20

## 2017-05-15 MED ORDER — ONDANSETRON HCL 4 MG PO TABS: 4.0000 mg | ORAL_TABLET | Freq: Four times a day (QID) | ORAL | Status: DC | PRN

## 2017-05-15 MED ORDER — HYDROMORPHONE HCL 1 MG/ML IJ SOLN
INTRAMUSCULAR | Status: AC
  Filled 2017-05-15: qty 1

## 2017-05-15 MED ORDER — BISACODYL 10 MG RE SUPP: 10.0000 mg | Freq: Every day | RECTAL | Status: DC | PRN

## 2017-05-15 MED ORDER — ONDANSETRON HCL 4 MG/2ML IJ SOLN: 4.0000 mg | Freq: Four times a day (QID) | INTRAMUSCULAR | Status: DC | PRN

## 2017-05-15 MED ORDER — ACETAMINOPHEN 500 MG PO TABS
1000.0000 mg | ORAL_TABLET | Freq: Four times a day (QID) | ORAL | Status: DC
  Administered 2017-05-15: 1000 mg via ORAL
  Filled 2017-05-15: qty 2

## 2017-05-15 MED ORDER — SENNOSIDES-DOCUSATE SODIUM 8.6-50 MG PO TABS: 1.0000 | ORAL_TABLET | Freq: Every evening | ORAL | Status: DC | PRN

## 2017-05-15 MED ORDER — DEXMEDETOMIDINE HCL IN NACL 200 MCG/50ML IV SOLN
INTRAVENOUS | Status: AC
  Filled 2017-05-15: qty 50

## 2017-05-15 MED ORDER — CLONIDINE HCL 0.1 MG PO TABS: 0.2000 mg | ORAL_TABLET | Freq: Every day | ORAL | Status: DC

## 2017-05-15 MED ORDER — SODIUM CHLORIDE 0.9% FLUSH: 3.0000 mL | INTRAVENOUS | Status: DC | PRN

## 2017-05-15 MED ORDER — METHOCARBAMOL 500 MG PO TABS
500.0000 mg | ORAL_TABLET | Freq: Four times a day (QID) | ORAL | Status: DC | PRN
  Administered 2017-05-15: 500 mg via ORAL

## 2017-05-15 MED ORDER — HYDROMORPHONE HCL 1 MG/ML IJ SOLN
0.2500 mg | INTRAMUSCULAR | Status: DC | PRN
  Administered 2017-05-15: 0.5 mg via INTRAVENOUS

## 2017-05-15 MED ORDER — GABAPENTIN 300 MG PO CAPS: 300.0000 mg | ORAL_CAPSULE | Freq: Three times a day (TID) | ORAL | Status: DC

## 2017-05-15 MED ORDER — BUPIVACAINE HCL (PF) 0.5 % IJ SOLN
INTRAMUSCULAR | Status: DC | PRN
  Administered 2017-05-15: 3 mL

## 2017-05-15 MED ORDER — THROMBIN (RECOMBINANT) 5000 UNITS EX SOLR
OROMUCOSAL | Status: DC | PRN
  Administered 2017-05-15: 5 mL via TOPICAL

## 2017-05-15 MED ORDER — ROCURONIUM BROMIDE 10 MG/ML (PF) SYRINGE
PREFILLED_SYRINGE | INTRAVENOUS | Status: AC
  Filled 2017-05-15: qty 5

## 2017-05-15 MED ORDER — FLEET ENEMA 7-19 GM/118ML RE ENEM: 1.0000 | ENEMA | Freq: Once | RECTAL | Status: DC | PRN

## 2017-05-15 MED ORDER — METHOCARBAMOL 500 MG PO TABS: 500.0000 mg | ORAL_TABLET | Freq: Four times a day (QID) | ORAL | Status: DC | PRN

## 2017-05-15 MED ORDER — CEFAZOLIN SODIUM-DEXTROSE 2-4 GM/100ML-% IV SOLN
2.0000 g | Freq: Three times a day (TID) | INTRAVENOUS | Status: DC
  Administered 2017-05-15: 2 g via INTRAVENOUS
  Filled 2017-05-15: qty 100

## 2017-05-15 MED ORDER — OXYCODONE HCL 5 MG PO TABS
ORAL_TABLET | ORAL | Status: AC
  Filled 2017-05-15: qty 2

## 2017-05-15 MED ORDER — DEXAMETHASONE SODIUM PHOSPHATE 4 MG/ML IJ SOLN
INTRAMUSCULAR | Status: DC | PRN
  Administered 2017-05-15: 10 mg via INTRAVENOUS

## 2017-05-15 MED ORDER — HYDROCODONE-ACETAMINOPHEN 5-325 MG PO TABS: 1.0000 | ORAL_TABLET | ORAL | Status: DC | PRN

## 2017-05-15 MED ORDER — MIDAZOLAM HCL 5 MG/5ML IJ SOLN
INTRAMUSCULAR | Status: DC | PRN
  Administered 2017-05-15: 2 mg via INTRAVENOUS

## 2017-05-15 MED ORDER — DEXAMETHASONE SODIUM PHOSPHATE 10 MG/ML IJ SOLN
INTRAMUSCULAR | Status: AC
  Filled 2017-05-15: qty 1

## 2017-05-15 MED ORDER — OXYCODONE HCL 5 MG PO TABS
10.0000 mg | ORAL_TABLET | ORAL | Status: DC | PRN
  Administered 2017-05-15 (×2): 10 mg via ORAL
  Filled 2017-05-15: qty 2

## 2017-05-15 MED ORDER — TRAZODONE HCL 100 MG PO TABS
100.0000 mg | ORAL_TABLET | Freq: Every day | ORAL | Status: DC
  Filled 2017-05-15: qty 1

## 2017-05-15 MED ORDER — BUPIVACAINE HCL (PF) 0.5 % IJ SOLN
INTRAMUSCULAR | Status: AC
  Filled 2017-05-15: qty 30

## 2017-05-15 MED ORDER — SENNA 8.6 MG PO TABS: 1.0000 | ORAL_TABLET | Freq: Two times a day (BID) | ORAL | Status: DC

## 2017-05-15 MED ORDER — PHENYLEPHRINE 40 MCG/ML (10ML) SYRINGE FOR IV PUSH (FOR BLOOD PRESSURE SUPPORT)
PREFILLED_SYRINGE | INTRAVENOUS | Status: AC
  Filled 2017-05-15: qty 10

## 2017-05-15 MED ORDER — ALUM & MAG HYDROXIDE-SIMETH 200-200-20 MG/5ML PO SUSP: 30.0000 mL | Freq: Four times a day (QID) | ORAL | Status: DC | PRN

## 2017-05-15 MED ORDER — ONDANSETRON HCL 4 MG/2ML IJ SOLN
INTRAMUSCULAR | Status: DC | PRN
  Administered 2017-05-15: 4 mg via INTRAVENOUS

## 2017-05-15 MED ORDER — BUDESONIDE 0.25 MG/2ML IN SUSP
0.2500 mg | Freq: Two times a day (BID) | RESPIRATORY_TRACT | Status: DC
  Filled 2017-05-15 (×2): qty 2

## 2017-05-15 MED ORDER — LIDOCAINE 2% (20 MG/ML) 5 ML SYRINGE
INTRAMUSCULAR | Status: DC | PRN
  Administered 2017-05-15: 60 mg via INTRAVENOUS

## 2017-05-15 MED ORDER — GLYCOPYRROLATE 0.2 MG/ML IJ SOLN
INTRAMUSCULAR | Status: DC | PRN
  Administered 2017-05-15: .8 mg via INTRAVENOUS

## 2017-05-15 MED ORDER — NEOSTIGMINE METHYLSULFATE 10 MG/10ML IV SOLN
INTRAVENOUS | Status: DC | PRN
  Administered 2017-05-15: 5 mg via INTRAVENOUS

## 2017-05-15 MED ORDER — OXYCODONE HCL 5 MG PO TABS: 5.0000 mg | ORAL_TABLET | ORAL | Status: DC | PRN

## 2017-05-15 MED ORDER — 0.9 % SODIUM CHLORIDE (POUR BTL) OPTIME
TOPICAL | Status: DC | PRN
  Administered 2017-05-15: 1000 mL

## 2017-05-15 MED ORDER — PROPOFOL 10 MG/ML IV BOLUS
INTRAVENOUS | Status: DC | PRN
  Administered 2017-05-15: 200 mg via INTRAVENOUS

## 2017-05-15 MED ORDER — CEFAZOLIN SODIUM-DEXTROSE 2-4 GM/100ML-% IV SOLN: 2.0000 g | Freq: Three times a day (TID) | INTRAVENOUS | Status: DC

## 2017-05-15 SURGICAL SUPPLY — 60 items
ADH SKN CLS APL DERMABOND .7 (GAUZE/BANDAGES/DRESSINGS) ×1
APL SKNCLS STERI-STRIP NONHPOA (GAUZE/BANDAGES/DRESSINGS)
BAG DECANTER FOR FLEXI CONT (MISCELLANEOUS) ×3 IMPLANT
BENZOIN TINCTURE PRP APPL 2/3 (GAUZE/BANDAGES/DRESSINGS) IMPLANT
BLADE CLIPPER SURG (BLADE) IMPLANT
BLADE SURG 11 STRL SS (BLADE) ×3 IMPLANT
BUR MATCHSTICK NEURO 3.0 LAGG (BURR) ×3 IMPLANT
CANISTER SUCT 3000ML PPV (MISCELLANEOUS) ×3 IMPLANT
CARTRIDGE OIL MAESTRO DRILL (MISCELLANEOUS) ×1 IMPLANT
CLOSURE WOUND 1/2 X4 (GAUZE/BANDAGES/DRESSINGS)
DECANTER SPIKE VIAL GLASS SM (MISCELLANEOUS) ×3 IMPLANT
DERMABOND ADVANCED (GAUZE/BANDAGES/DRESSINGS) ×2
DERMABOND ADVANCED .7 DNX12 (GAUZE/BANDAGES/DRESSINGS) ×1 IMPLANT
DIFFUSER DRILL AIR PNEUMATIC (MISCELLANEOUS) ×3 IMPLANT
DRAPE LAPAROTOMY 100X72X124 (DRAPES) ×3 IMPLANT
DRAPE MICROSCOPE LEICA (MISCELLANEOUS) ×3 IMPLANT
DRAPE POUCH INSTRU U-SHP 10X18 (DRAPES) ×3 IMPLANT
DRAPE SURG 17X23 STRL (DRAPES) ×3 IMPLANT
DRSG OPSITE POSTOP 3X4 (GAUZE/BANDAGES/DRESSINGS) ×3 IMPLANT
DURAPREP 26ML APPLICATOR (WOUND CARE) ×3 IMPLANT
ELECT REM PT RETURN 9FT ADLT (ELECTROSURGICAL) ×3
ELECTRODE REM PT RTRN 9FT ADLT (ELECTROSURGICAL) ×1 IMPLANT
GAUZE SPONGE 4X4 12PLY STRL (GAUZE/BANDAGES/DRESSINGS) IMPLANT
GAUZE SPONGE 4X4 16PLY XRAY LF (GAUZE/BANDAGES/DRESSINGS) IMPLANT
GLOVE BIO SURGEON STRL SZ7 (GLOVE) IMPLANT
GLOVE BIOGEL PI IND STRL 7.0 (GLOVE) IMPLANT
GLOVE BIOGEL PI IND STRL 7.5 (GLOVE) ×1 IMPLANT
GLOVE BIOGEL PI INDICATOR 7.0 (GLOVE)
GLOVE BIOGEL PI INDICATOR 7.5 (GLOVE) ×2
GLOVE ECLIPSE 7.0 STRL STRAW (GLOVE) ×3 IMPLANT
GLOVE EXAM NITRILE LRG STRL (GLOVE) IMPLANT
GLOVE EXAM NITRILE XL STR (GLOVE) IMPLANT
GLOVE EXAM NITRILE XS STR PU (GLOVE) IMPLANT
GOWN STRL REUS W/ TWL LRG LVL3 (GOWN DISPOSABLE) ×3 IMPLANT
GOWN STRL REUS W/ TWL XL LVL3 (GOWN DISPOSABLE) IMPLANT
GOWN STRL REUS W/TWL 2XL LVL3 (GOWN DISPOSABLE) IMPLANT
GOWN STRL REUS W/TWL LRG LVL3 (GOWN DISPOSABLE) ×9
GOWN STRL REUS W/TWL XL LVL3 (GOWN DISPOSABLE)
HEMOSTAT POWDER KIT SURGIFOAM (HEMOSTASIS) ×3 IMPLANT
KIT BASIN OR (CUSTOM PROCEDURE TRAY) ×3 IMPLANT
KIT ROOM TURNOVER OR (KITS) ×3 IMPLANT
NEEDLE HYPO 18GX1.5 BLUNT FILL (NEEDLE) IMPLANT
NEEDLE HYPO 25X1 1.5 SAFETY (NEEDLE) ×3 IMPLANT
NEEDLE SPNL 18GX3.5 QUINCKE PK (NEEDLE) IMPLANT
NS IRRIG 1000ML POUR BTL (IV SOLUTION) ×3 IMPLANT
OIL CARTRIDGE MAESTRO DRILL (MISCELLANEOUS) ×3
PACK LAMINECTOMY NEURO (CUSTOM PROCEDURE TRAY) ×3 IMPLANT
PAD ARMBOARD 7.5X6 YLW CONV (MISCELLANEOUS) ×9 IMPLANT
RUBBERBAND STERILE (MISCELLANEOUS) ×6 IMPLANT
SPONGE LAP 4X18 X RAY DECT (DISPOSABLE) IMPLANT
SPONGE SURGIFOAM ABS GEL SZ50 (HEMOSTASIS) ×3 IMPLANT
STRIP CLOSURE SKIN 1/2X4 (GAUZE/BANDAGES/DRESSINGS) IMPLANT
SUT VIC AB 0 CT1 18XCR BRD8 (SUTURE) ×1 IMPLANT
SUT VIC AB 0 CT1 8-18 (SUTURE) ×3
SUT VIC AB 2-0 CT1 18 (SUTURE) IMPLANT
SUT VICRYL 3-0 RB1 18 ABS (SUTURE) ×9 IMPLANT
SYR 3ML LL SCALE MARK (SYRINGE) IMPLANT
TOWEL GREEN STERILE (TOWEL DISPOSABLE) ×3 IMPLANT
TOWEL GREEN STERILE FF (TOWEL DISPOSABLE) ×3 IMPLANT
WATER STERILE IRR 1000ML POUR (IV SOLUTION) ×3 IMPLANT

## 2017-05-15 NOTE — Progress Notes (Signed)
Patient is discharged from room 3C10 at this time. Alert and in stable condition. IV site d/c'd and instructions read to patient and mother with understanding verbalized. Left unit via wheelchair with all belongings at side.

## 2017-05-15 NOTE — Anesthesia Procedure Notes (Signed)
Procedure Name: Intubation Date/Time: 05/15/2017 12:56 PM Performed by: Inda Coke, CRNA Pre-anesthesia Checklist: Patient identified, Emergency Drugs available, Suction available and Patient being monitored Patient Re-evaluated:Patient Re-evaluated prior to induction Oxygen Delivery Method: Circle System Utilized Preoxygenation: Pre-oxygenation with 100% oxygen Induction Type: IV induction Ventilation: Mask ventilation without difficulty and Oral airway inserted - appropriate to patient size Laryngoscope Size: Mac and 3 Grade View: Grade I Tube type: Oral Tube size: 7.0 mm Number of attempts: 1 Airway Equipment and Method: Stylet and Oral airway Placement Confirmation: ETT inserted through vocal cords under direct vision,  positive ETCO2 and breath sounds checked- equal and bilateral Secured at: 22 cm Tube secured with: Tape Dental Injury: Teeth and Oropharynx as per pre-operative assessment

## 2017-05-15 NOTE — H&P (Signed)
Chief Complaint   Back pain  HPI   HPI: Anna Nixon is a 22 y.o. female who presents today for elective surgery. Long standing history of LBP. Pain started 2 years ago.  She was stretching and felt a pop in her left lower back and since then has been having pain.  Pain only affects the left-side & radiates down her left leg to her toes.  It is a burning sensation.  Pain is a 10/10 at all times.  Pain is exacerbated by walking, sitting and standing.  She has found that lying down will help temporarily alleviate the pain.  She had worked its is weakness of the left lower extremity and has fallen multiple times due to this weakness.  She has been managed conservatively over the last 2 years with steroids, anti-inflammatories, muscle relaxers and narcotics without relief.  She has also been to physical therapy which she reports was not helpful.  She had an epidural injection, unsure of who did the injection, which lasted several days.  She has not had any epidural since then.  She denies any bowel or bladder dysfunction.  Endorses numbness and tingling of the left lower extremity.  MRI reveals large left L5-S1 disc herniation.    There are no active problems to display for this patient.   PMH: Past Medical History:  Diagnosis Date  . Allergy   . Asthma   . Depression   . Headache   . Insomnia    pt takes clonidine for insomnia    PSH: Past Surgical History:  Procedure Laterality Date  . WISDOM TOOTH EXTRACTION    . WISDOM TOOTH EXTRACTION      Medications Prior to Admission  Medication Sig Dispense Refill Last Dose  . buPROPion (WELLBUTRIN XL) 300 MG 24 hr tablet Take 300 mg daily by mouth.   05/14/2017 at Unknown time  . cloNIDine (CATAPRES) 0.1 MG tablet Take 0.2 mg by mouth at bedtime. Patient takes for insomnia   05/14/2017 at Unknown time  . traZODone (DESYREL) 100 MG tablet Take 100 mg at bedtime by mouth.   05/14/2017 at Unknown time  . beclomethasone (QVAR) 40 MCG/ACT inhaler  Inhale 2 puffs into the lungs daily.    More than a month at Unknown time    SH: Social History   Tobacco Use  . Smoking status: Passive Smoke Exposure - Never Smoker  . Smokeless tobacco: Never Used  Substance Use Topics  . Alcohol use: No  . Drug use: No    MEDS: Prior to Admission medications   Medication Sig Start Date End Date Taking? Authorizing Provider  buPROPion (WELLBUTRIN XL) 300 MG 24 hr tablet Take 300 mg daily by mouth.   Yes [provider]  cloNIDine (CATAPRES) 0.1 MG tablet Take 0.2 mg by mouth at bedtime. Patient takes for insomnia   Yes [provider]  traZODone (DESYREL) 100 MG tablet Take 100 mg at bedtime by mouth.   Yes [provider]  beclomethasone (QVAR) 40 MCG/ACT inhaler Inhale 2 puffs into the lungs daily.     [provider]    ALLERGY: No Known Allergies  Social History   Tobacco Use  . Smoking status: Passive Smoke Exposure - Never Smoker  . Smokeless tobacco: Never Used  Substance Use Topics  . Alcohol use: No     Family History  Problem Relation Age of Onset  . Hyperlipidemia Mother   . Hypertension Mother   . Diabetes Other   .  Hypertension Maternal Grandmother   . Hyperlipidemia Maternal Grandmother      ROS   Review of Systems  Constitutional: Negative.   HENT: Negative.   Eyes: Negative.   Respiratory: Negative.   Cardiovascular: Negative.   Gastrointestinal: Negative.   Genitourinary: Negative.   Musculoskeletal: Positive for back pain and myalgias.  Skin: Negative.   Neurological: Positive for tingling (LLE). Negative for dizziness, tremors, sensory change, speech change, focal weakness, seizures, loss of consciousness and headaches.    Exam   Vitals:   05/15/17 1100  BP: 125/74  Pulse: (!) 102  Resp: 18  Temp: 98.1 F (36.7 C)  SpO2: 100%   General appearance: WDWN, NAD Eyes: PERRL, Fundoscopic: normal Cardiovascular: Regular rate and rhythm without murmurs, rubs,  gallops. No edema or variciosities. Distal pulses normal. Pulmonary: Clear to auscultation Musculoskeletal:     Muscle tone upper extremities: Normal    Muscle tone lower extremities: Normal    Motor exam: Upper Extremities Deltoid Bicep Tricep Grip  Right 5/5 5/5 5/5 5/5  Left 5/5 5/5 5/5 5/5   Lower Extremity IP Quad PF DF EHL  Right 5/5 5/5 5/5 5/5 5/5  Left 4/5 4/5 4/5 4/5 4/5   Neurological Awake, alert, oriented Memory and concentration grossly intact Speech fluent, appropriate CNII: Visual fields normal CNIII/IV/VI: EOMI CNV: Facial sensation normal CNVII: Symmetric, normal strength CNVIII: Grossly normal CNIX: Normal palate movement CNXI: Trap and SCM strength normal CN XII: Tongue protrusion normal Sensation grossly intact to LT DTR: Normal Coordination (finger/nose & heel/shin): Normal  Results - Imaging/Labs   No results found for this or any previous visit (from the past 48 hour(s)).  No results found.  Impression/Plan   22 y.o. female with chronic left-sided back and leg pain likely related to large left L5-S1 disc herniation.  She has failed reasonable conservative treatments including po medications, exercise program and epidural injections.  we will plan on proceeding with left L5-S1 laminotomy and microdiskectomy  We again discussed continued conservative treatment versus surgical decompression.  The risks of the surgery were reviewed in detail with the patient again, including but not limited to bleeding, infection, nerve injury resulting in leg/foot weakness/numbness or bowel/bladder dysfunction, and CSF leak. Possible outcomes of surgery were also discussed including the possibility of persistence or worsening of pain symptoms. We discussed the possible need for additional spinal surgery in the future. The general risks of anesthesia were also reviewed including heart attack, stroke, and DVT/PE.  She states in own language understanding of the procedure  and wishes to proceed.

## 2017-05-15 NOTE — Op Note (Signed)
PREOP DIAGNOSIS: Lumbar disc herniation, L5-S1  POSTOP DIAGNOSIS: Same  PROCEDURE: 1. Left L5-S1 laminotomy, partial medial facetectomy with foraminotomy, and microdiscectomy for decompression of nerve root 2. Use of operating microscope  SURGEON: Dr. Lisbeth RenshawNeelesh Dayle Mcnerney, MD  ASSISTANT: Cindra PresumeVincent Costella, PA-C  ANESTHESIA: General Endotracheal  EBL: 50cc  SPECIMENS: None  DRAINS: None  COMPLICATIONS: None  CONDITION: None immediate  HISTORY: Anna Nixon is a 22 y.o. female with approximately 2-year history of left-sided low back and leg pain.  She had previous MRI which demonstrated fairly broad-based left eccentric L5-S1 disc herniation.  She attempted multiple different conservative treatments including physical therapy, medical therapy, and epidural steroid injections.  Unfortunately, she had progression of her pain.  She therefore elected to proceed with surgical decompression.  Risks and benefits of the surgery were explained in detail with the patient and her family.  After all questions were answered informed consent was obtained and witnessed.  PROCEDURE IN DETAIL: After informed consent was obtained and witnessed, the patient was brought to the operating room. After induction of general anesthesia, the patient was positioned on the operative table in the prone position with all pressure points meticulously padded. The skin of the low back was then prepped and draped in the usual sterile fashion.  With Xray, the correct level was identified and marked out on the skin, and after timeout was conducted, the skin was infiltrated with local anesthetic. Skin incision was then made sharply and Bovie electrocautery was used to dissect the subcutaneous tissue until the lumbodorsal fascia was identified. The fascia was then incised using Bovie electrocautery and the lamina at the L5 and S1 on the left was identified and dissection was carried out in the subperiosteal plane. Self-retaining  retractor was then placed, and intraoperative x-ray was taken to confirm we were at the correct level.  Using a high-speed drill, laminotomy was completed with a partial medial facetectomy. The ligamentum flavum was then identified and removed and the lateral edge of the thecal sac and the S1 nerve root on the left was identified. Dissection was then carried out in the ventral epidural space to identify fairly large, broad disc bulge beneath the posterior longitudinal ligament compressing the left S1 nerve root.  The posterior annulus was then incised and using a combination of dissectors, curettes, and rongeurs, the herniated disc fragments were removed. The decompression of the nerve root was confirmed using a dissector.  Hemostasis was then secured using a combination of morcellized Gelfoam and thrombin and bipolar electrocautery. The wound is irrigated with copious amounts of antibiotic saline irrigation. The nerve root was then covered with a long-acting steroid solution. Self-retaining retractor was then removed, and the wound is closed in layers using a combination of interrupted 0 Vicryl and 3-0 Vicryl stitches. The skin was closed using standard skin glue.  At the end of the case all sponge, needle, and instrument counts were correct. The patient was then transferred to the stretcher and taken to the postanesthesia care unit in stable hemodynamic condition.

## 2017-05-15 NOTE — Transfer of Care (Signed)
Immediate Anesthesia Transfer of Care Note  Patient: Anna Nixon  Procedure(s) Performed: MICRODISCECTOMY LEFT POSSIBLE BILATERAL LUMBAR FIVE - SACRAL ONE  LAMINOTOMY (N/A Back)  Patient Location: PACU  Anesthesia Type:General  Level of Consciousness: awake and alert   Airway & Oxygen Therapy: Patient Spontanous Breathing and Patient connected to nasal cannula oxygen  Post-op Assessment: Report given to RN, Post -op Vital signs reviewed and stable and Patient moving all extremities X 4  Post vital signs: Reviewed and stable  Last Vitals:  Vitals:   05/15/17 1100  BP: 125/74  Pulse: (!) 102  Resp: 18  Temp: 36.7 C  SpO2: 100%    Last Pain:  Vitals:   05/15/17 1100  TempSrc: Oral  PainSc:       Patients Stated Pain Goal: 2 (05/15/17 1055)  Complications: No apparent anesthesia complications

## 2017-05-15 NOTE — Anesthesia Preprocedure Evaluation (Signed)
Anesthesia Evaluation  Patient identified by MRN, date of birth, ID band Patient awake    Reviewed: Allergy & Precautions, H&P , Patient's Chart, lab work & pertinent test results, reviewed documented beta blocker date and time   Airway Mallampati: II  TM Distance: >3 FB Neck ROM: full    Dental no notable dental hx.    Pulmonary    Pulmonary exam normal breath sounds clear to auscultation       Cardiovascular  Rhythm:regular Rate:Normal     Neuro/Psych    GI/Hepatic   Endo/Other  Morbid obesity  Renal/GU      Musculoskeletal   Abdominal   Peds  Hematology   Anesthesia Other Findings   Reproductive/Obstetrics                             Anesthesia Physical Anesthesia Plan  ASA: III  Anesthesia Plan: General   Post-op Pain Management:    Induction: Intravenous  PONV Risk Score and Plan: 2 and Treatment may vary due to age or medical condition, Dexamethasone and Ondansetron  Airway Management Planned: Oral ETT  Additional Equipment:   Intra-op Plan:   Post-operative Plan: Extubation in OR  Informed Consent: I have reviewed the patients History and Physical, chart, labs and discussed the procedure including the risks, benefits and alternatives for the proposed anesthesia with the patient or authorized representative who has indicated his/her understanding and acceptance.   Dental Advisory Given  Plan Discussed with: CRNA and Surgeon  Anesthesia Plan Comments: (  )        Anesthesia Quick Evaluation

## 2017-05-15 NOTE — Brief Op Note (Signed)
05/15/2017  2:45 PM  PATIENT:  Anna Nixon  22 y.o. female  PRE-OPERATIVE DIAGNOSIS:  HERNIATED NUCLEUS PULPOSUS  POST-OPERATIVE DIAGNOSIS:  HERNIATED NUCLEUS PULPOSUS  PROCEDURE:  Procedure(s): MICRODISCECTOMY LEFT POSSIBLE BILATERAL LUMBAR FIVE - SACRAL ONE  LAMINOTOMY (N/A)  SURGEON:  Surgeon(s) and Role:    * Lisbeth RenshawNundkumar, Neelesh, MD - Primary  PHYSICIAN ASSISTANT: Cindra PresumeVincent Costella, PA-C  ANESTHESIA:   general  EBL:  25 mL   BLOOD ADMINISTERED:none  DRAINS: none   LOCAL MEDICATIONS USED:  LIDOCAINE   SPECIMEN:  No Specimen  DISPOSITION OF SPECIMEN:  N/A  COUNTS:  YES  TOURNIQUET:  * No tourniquets in log *  DICTATION: .Note written in EPIC  PLAN OF CARE: Admit to inpatient   PATIENT DISPOSITION:  PACU - hemodynamically stable.   Delay start of Pharmacological VTE agent (>24hrs) due to surgical blood loss or risk of bleeding: yes

## 2017-05-15 NOTE — Discharge Summary (Signed)
Physician Discharge Summary  Patient ID: Anna Nixon MRN: [id number] DOB/AGE: 12-Sep-1995 21 y.o.  Admit date: 05/15/2017 Discharge date: 05/15/2017  Admission Diagnoses: Lumbar disc herniation with radiculopathy, L5-S1  Discharge Diagnoses: Same Active Problems:   HNP (herniated nucleus pulposus), lumbar   Discharged Condition: Stable  Hospital Course:  Anna Nixon is a 22 y.o. female who presented to the clinic with left back and leg pain and MRI demonstrating L5-S1 disc herniation. The patient was admitted for elective left L5-S1 laminotomy and microdiscectomy which was done without complication. Postop she was at her neurologic baseline. Back pain was controlled with oral medication, she was ambulating without difficulty, voiding normally, and tolerating diet.  Treatments: Surgery - left L5-S1 laminotomy, microdiscectomy  Discharge Exam: Blood pressure 138/82, pulse 77, temperature 98.4 F (36.9 C), resp. rate (!) 22, height 5' 8.5" (1.74 m), weight (!) 149.6 kg (329 lb 12.8 oz), last menstrual period 05/01/2017, SpO2 94 %. Awake, alert, oriented Speech fluent, appropriate CN grossly intact 5/5 BUE/BLE Wound c/d/i  Follow-up: Follow-up in my office [facility]( Neurosurgery and Spine [phone]) in 2-3 weeks  Disposition: 01-Home or Self Care  Discharge Instructions    Call MD for:  redness, tenderness, or signs of infection (pain, swelling, redness, odor or green/yellow discharge around incision site)   Complete by:  As directed    Call MD for:  temperature >100.4   Complete by:  As directed    Diet - low sodium heart healthy   Complete by:  As directed    Discharge instructions   Complete by:  As directed    Walk at home as much as possible, at least 4 times / day   Increase activity slowly   Complete by:  As directed    Lifting restrictions   Complete by:  As directed    No lifting > 10 lbs   May shower / Bathe   Complete by:  As directed    48 hours  after surgery   May walk up steps   Complete by:  As directed    No dressing needed   Complete by:  As directed    Other Restrictions   Complete by:  As directed    No bending/twisting at waist     Allergies as of 05/15/2017   No Known Allergies     Medication List    TAKE these medications   beclomethasone 40 MCG/ACT inhaler Commonly known as:  QVAR Inhale 2 puffs into the lungs daily.   buPROPion 300 MG 24 hr tablet Commonly known as:  WELLBUTRIN XL Take 300 mg daily by mouth.   cloNIDine 0.1 MG tablet Commonly known as:  CATAPRES Take 0.2 mg by mouth at bedtime. Patient takes for insomnia   methocarbamol 500 MG tablet Commonly known as:  ROBAXIN Take 1 tablet (500 mg total) by mouth every 8 (eight) hours as needed for muscle spasms.   Oxycodone HCl 10 MG Tabs Take 1 tablet (10 mg total) by mouth every 3 (three) hours as needed for severe pain ((score 7 to 10)).   traZODone 100 MG tablet Commonly known as:  DESYREL Take 100 mg at bedtime by mouth.      Follow-up Information    Lisbeth RenshawNundkumar, Sharmane Dame, MD In 2 weeks.   Specialty:  Neurosurgery Why:  For wound re-check Contact information: [address] [place] [address] [phone]           Signed: Jackelyn Hoehneelesh C Jaquavis Felmlee 05/15/2017, 4:49 PM

## 2017-05-16 ENCOUNTER — Encounter (HOSPITAL_COMMUNITY): Payer: Self-pay | Admitting: Neurosurgery

## 2017-05-16 NOTE — Anesthesia Postprocedure Evaluation (Signed)
Anesthesia Post Note  Patient: Anna Nixon  Procedure(s) Performed: MICRODISCECTOMY LEFT POSSIBLE BILATERAL LUMBAR FIVE - SACRAL ONE  LAMINOTOMY (N/A Back)     Patient location during evaluation: PACU Anesthesia Type: General Level of consciousness: awake and alert Pain management: pain level controlled Vital Signs Assessment: post-procedure vital signs reviewed and stable Respiratory status: spontaneous breathing, nonlabored ventilation, respiratory function stable and patient connected to nasal cannula oxygen Cardiovascular status: blood pressure returned to baseline and stable Postop Assessment: no apparent nausea or vomiting Anesthetic complications: no    Last Vitals:  Vitals:   05/15/17 1652 05/15/17 1715  BP: 138/74 132/61  Pulse: 77 98  Resp: (!) 21 (!) 22  Temp: (!) 36.3 C 36.9 C  SpO2: 96% 94%    Last Pain:  Vitals:   05/15/17 1825  TempSrc:   PainSc: 8                  Jacole Capley S

## 2017-07-06 IMAGING — MR MR LUMBAR SPINE W/O CM
4 of 5 series · 19 of 48 positions shown · non-contrast
Comparison: None.

CLINICAL DATA: Back pain radiating to the left leg, 9 months
duration.

EXAM:
MRI LUMBAR SPINE WITHOUT CONTRAST
TECHNIQUE: Multiplanar, multisequence MR imaging of the lumbar spine was
performed. No intravenous contrast was administered.

[Series 6: T2 · sagittal · 4.0mm · 0.73mm/px · 6 of 15 slices shown (1 of 2)]
[im 1/15]
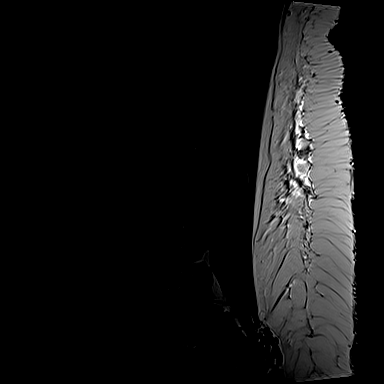
[im 3/15]
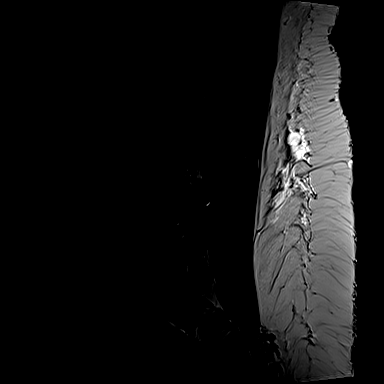
[im 6/15]
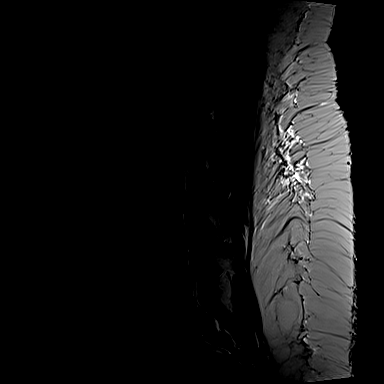
[im 9/15]
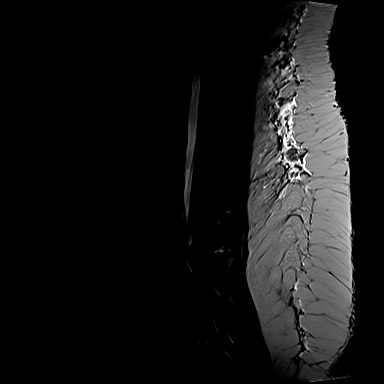
[im 12/15]
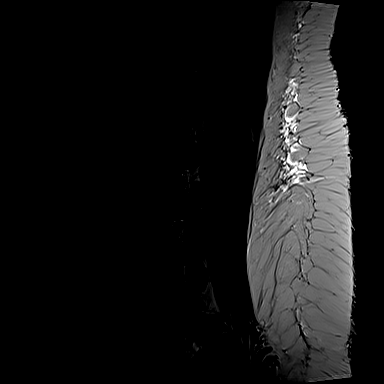
[im 15/15]
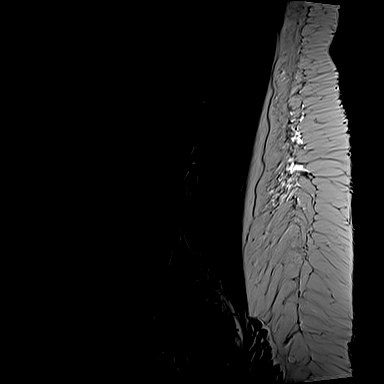

[Series 8: T1 · sagittal · 4.0mm · 0.73mm/px · 3 of 15 slices shown (1 of 2)]
[im 3/15]
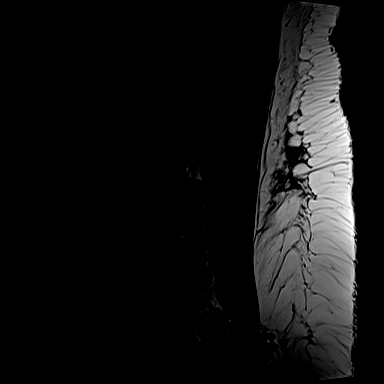
[im 9/15]
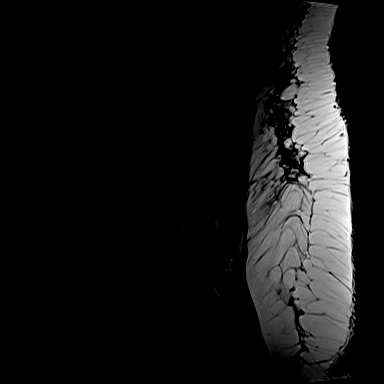
[im 15/15]
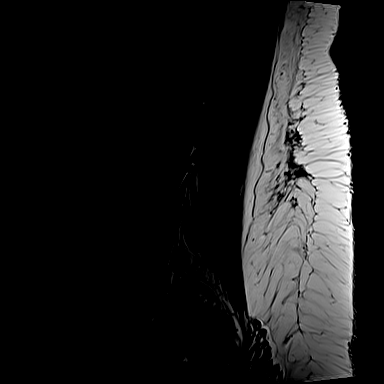

[Series 11: T1 · axial · 4.0mm · 0.31mm/px · z∈[-110,+29]mm · 3 of 35 slices shown (2 of 2)]
[im 5/35]
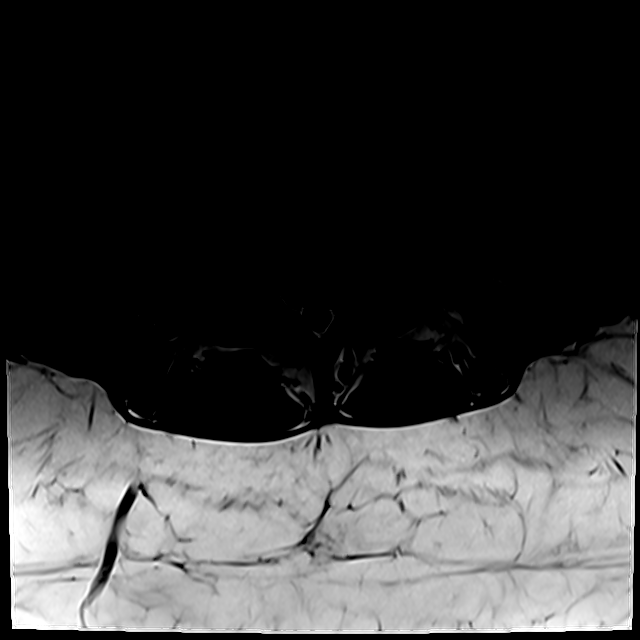
[im 18/35]
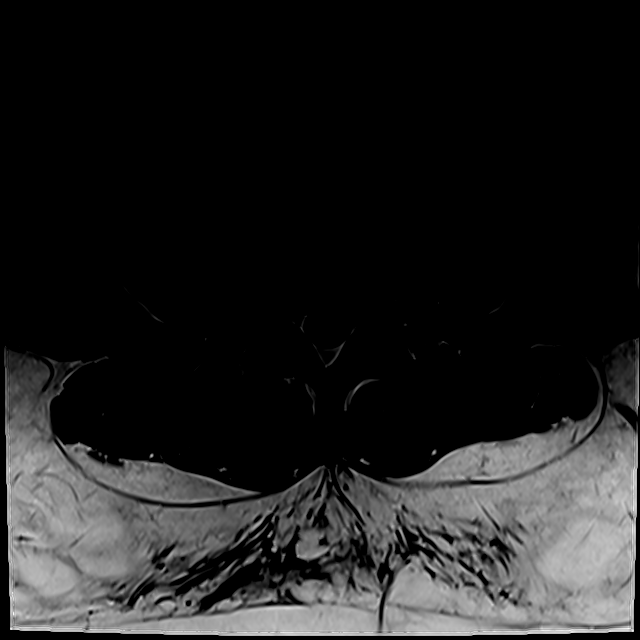
[im 30/35]
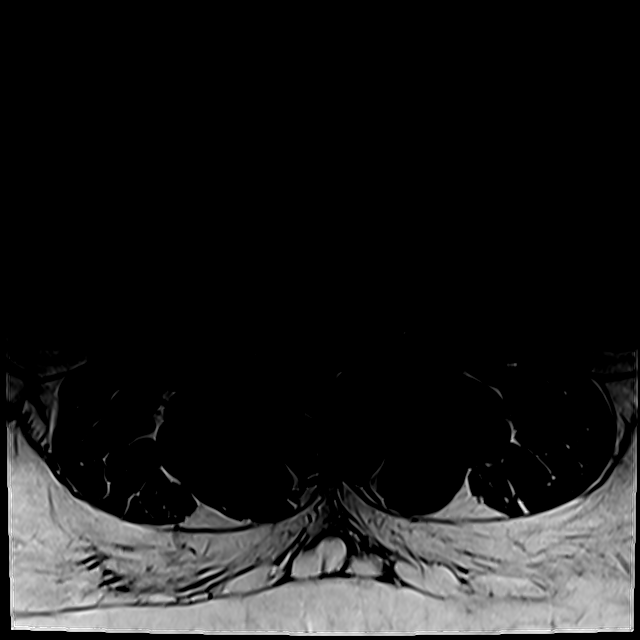

[Series 14: T2 · axial · 4.0mm · 0.31mm/px · z∈[-130,+29]mm · 7 of 35 slices shown (2 of 2)]
[im 1/35]
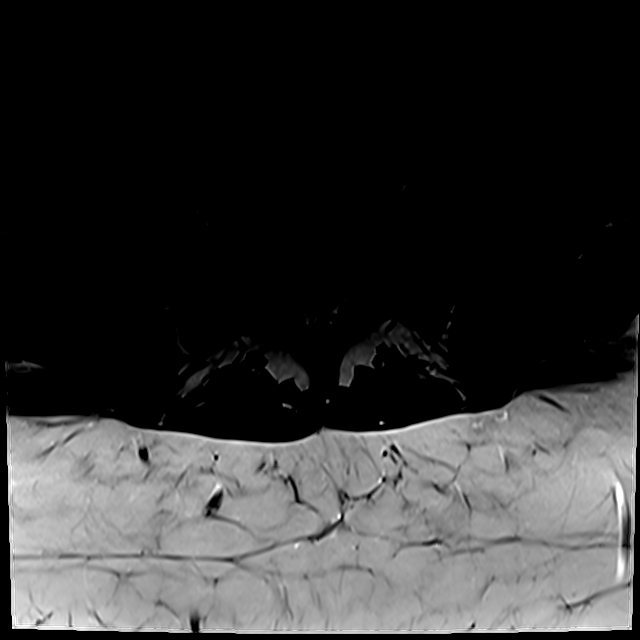
[im 5/35]
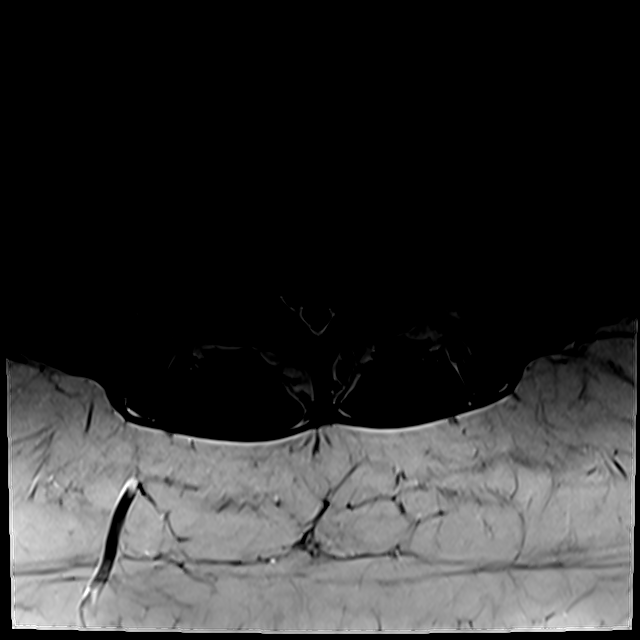
[im 10/35]
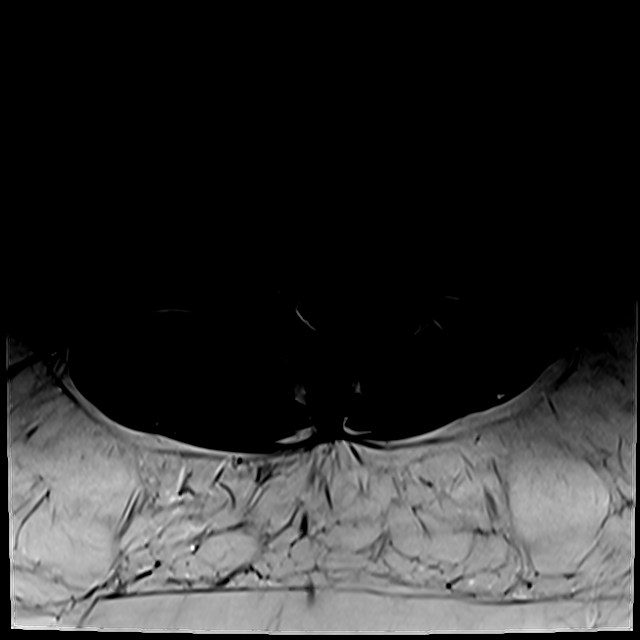
[im 15/35]
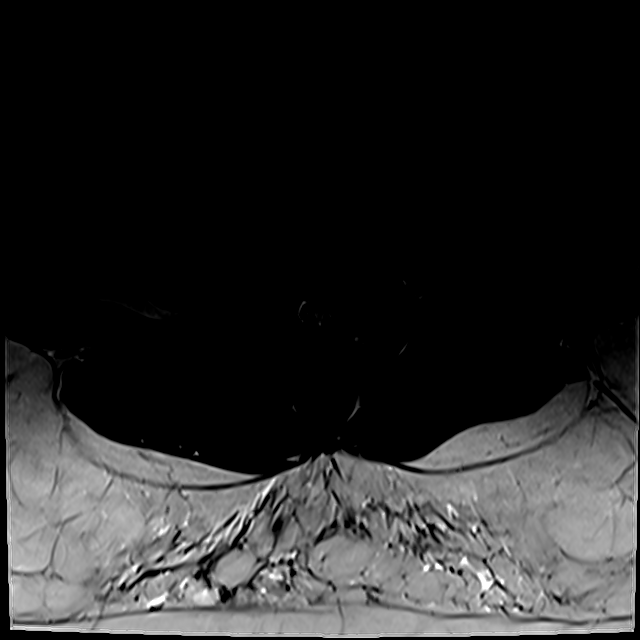
[im 18/35]
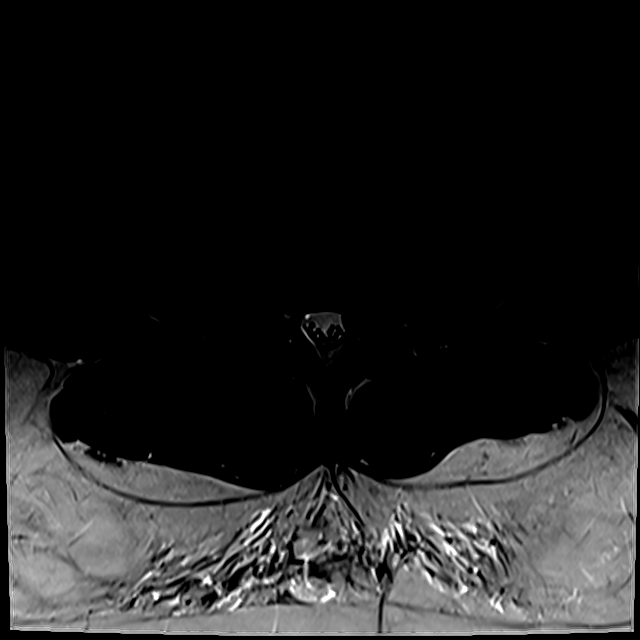
[im 20/35]
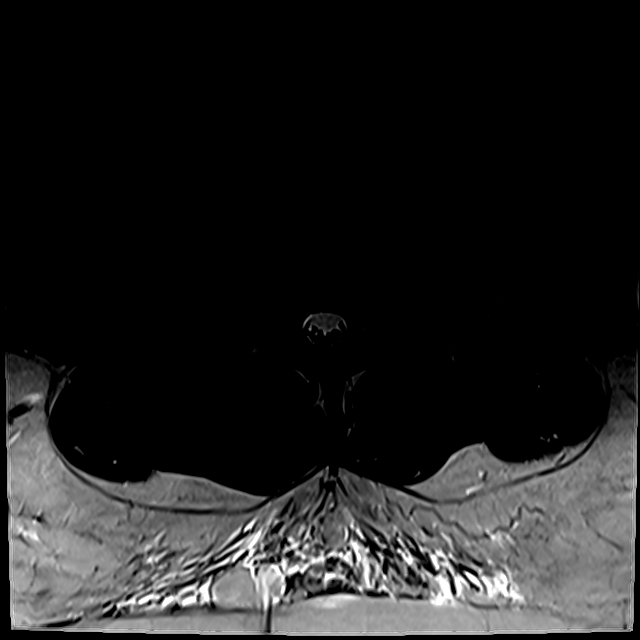
[im 30/35]
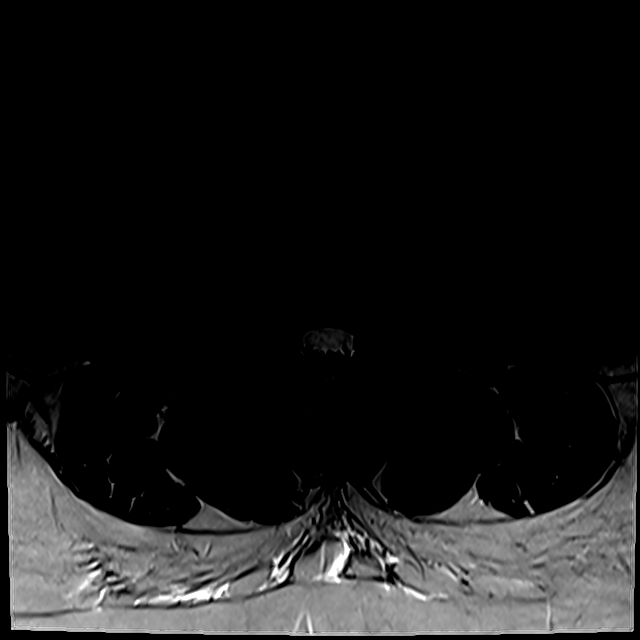

[19 of 48 positions shown; findings below may reference images not displayed]

FINDINGS: There is no abnormality at L4-5 or above. The discs are normal. The
canal and foramina are widely patent. The distal cord and conus are
normal with the conus tip at upper L1.

L5-S1: Left posterior lateral disc herniation displaces and
compresses the left S1 nerve root. No migration of the disc.
IMPRESSION: Left posterior lateral disc herniation at L5-S1 compressing and
displacing the left S1 nerve root.

## 2018-11-18 ENCOUNTER — Other Ambulatory Visit: Payer: Self-pay

## 2018-11-18 DIAGNOSIS — B349 Viral infection, unspecified: Secondary | ICD-10-CM | POA: Insufficient documentation

## 2018-11-18 DIAGNOSIS — U071 COVID-19: Secondary | ICD-10-CM | POA: Diagnosis not present

## 2018-11-18 DIAGNOSIS — R112 Nausea with vomiting, unspecified: Secondary | ICD-10-CM | POA: Diagnosis not present

## 2018-11-18 DIAGNOSIS — R1084 Generalized abdominal pain: Secondary | ICD-10-CM | POA: Diagnosis not present

## 2018-11-18 DIAGNOSIS — R102 Pelvic and perineal pain: Secondary | ICD-10-CM | POA: Diagnosis not present

## 2018-11-18 DIAGNOSIS — R070 Pain in throat: Secondary | ICD-10-CM | POA: Insufficient documentation

## 2018-11-18 DIAGNOSIS — Z7722 Contact with and (suspected) exposure to environmental tobacco smoke (acute) (chronic): Secondary | ICD-10-CM | POA: Insufficient documentation

## 2018-11-18 DIAGNOSIS — R1013 Epigastric pain: Secondary | ICD-10-CM | POA: Diagnosis present

## 2018-11-18 DIAGNOSIS — J45909 Unspecified asthma, uncomplicated: Secondary | ICD-10-CM | POA: Insufficient documentation

## 2018-11-18 DIAGNOSIS — R197 Diarrhea, unspecified: Secondary | ICD-10-CM | POA: Diagnosis not present

## 2018-11-18 NOTE — ED Notes (Addendum)
Pt is waiting outside until called due to pt's ride to the hospital is positive for COVID-19.

## 2018-11-19 ENCOUNTER — Emergency Department (HOSPITAL_COMMUNITY)
Admission: EM | Admit: 2018-11-19 | Discharge: 2018-11-19 | Disposition: A | Discharge status: Home / Self Care | Payer: HRSA Program | Attending: Emergency Medicine | Admitting: Emergency Medicine

## 2018-11-19 ENCOUNTER — Encounter (HOSPITAL_COMMUNITY): Payer: Self-pay

## 2018-11-19 DIAGNOSIS — Z20822 Contact with and (suspected) exposure to covid-19: Secondary | ICD-10-CM

## 2018-11-19 DIAGNOSIS — R1084 Generalized abdominal pain: Secondary | ICD-10-CM

## 2018-11-19 DIAGNOSIS — B349 Viral infection, unspecified: Secondary | ICD-10-CM

## 2018-11-19 DIAGNOSIS — Z20828 Contact with and (suspected) exposure to other viral communicable diseases: Secondary | ICD-10-CM

## 2018-11-19 LAB — COMPREHENSIVE METABOLIC PANEL
ALT: 14 U/L (ref 0–44)
AST: 25 U/L (ref 15–41)
Albumin: 4 g/dL (ref 3.5–5.0)
Alkaline Phosphatase: 70 U/L (ref 38–126)
Anion gap: 7 (ref 5–15)
BUN: 14 mg/dL (ref 6–20)
CO2: 24 mmol/L (ref 22–32)
Calcium: 9 mg/dL (ref 8.9–10.3)
Chloride: 107 mmol/L (ref 98–111)
Creatinine, Ser: 0.55 mg/dL (ref 0.44–1.00)
GFR calc Af Amer: >60 mL/min (ref >60)
GFR calc non Af Amer: >60 mL/min (ref >60)
Glucose, Bld: 93 mg/dL (ref 70–99)
Potassium: 4.3 mmol/L (ref 3.5–5.1)
Sodium: 138 mmol/L (ref 135–145)
Total Bilirubin: 0.2 mg/dL — ABNORMAL LOW (ref 0.3–1.2)
Total Protein: 8.1 g/dL (ref 6.5–8.1)

## 2018-11-19 LAB — URINALYSIS, ROUTINE W REFLEX MICROSCOPIC
Bilirubin Urine: NEGATIVE
Glucose, UA: NEGATIVE mg/dL
Hgb urine dipstick: NEGATIVE
Ketones, ur: NEGATIVE mg/dL
Nitrite: NEGATIVE
Protein, ur: NEGATIVE mg/dL
Specific Gravity, Urine: 1.027 (ref 1.005–1.030)
pH: 5 (ref 5.0–8.0)

## 2018-11-19 LAB — CBC
HCT: 36.4 % (ref 36.0–46.0)
Hemoglobin: 11.2 g/dL — ABNORMAL LOW (ref 12.0–15.0)
MCH: 23.4 pg — ABNORMAL LOW (ref 26.0–34.0)
MCHC: 30.8 g/dL (ref 30.0–36.0)
MCV: 76.2 fL — ABNORMAL LOW (ref 80.0–100.0)
Platelets: 555 10*3/uL — ABNORMAL HIGH (ref 150–400)
RBC: 4.78 MIL/uL (ref 3.87–5.11)
RDW: 18 % — ABNORMAL HIGH (ref 11.5–15.5)
WBC: 10.4 10*3/uL (ref 4.0–10.5)
nRBC: 0 % (ref 0.0–0.2)

## 2018-11-19 LAB — LIPASE, BLOOD: Lipase: 19 U/L (ref 11–51)

## 2018-11-19 LAB — I-STAT BETA HCG BLOOD, ED (MC, WL, AP ONLY): I-stat hCG, quantitative: <5 m[IU]/mL (ref <5)

## 2018-11-19 MED ORDER — ONDANSETRON 4 MG PO TBDP: 4.0000 mg | ORAL_TABLET | Freq: Three times a day (TID) | ORAL | 0 refills | Status: AC | PRN

## 2018-11-19 MED ORDER — DICYCLOMINE HCL 20 MG PO TABS: 20.0000 mg | ORAL_TABLET | Freq: Two times a day (BID) | ORAL | 0 refills | Status: AC | PRN

## 2018-11-19 MED ORDER — IBUPROFEN 600 MG PO TABS: 600.0000 mg | ORAL_TABLET | Freq: Four times a day (QID) | ORAL | 0 refills | Status: AC | PRN

## 2018-11-19 MED ORDER — METOCLOPRAMIDE HCL 5 MG/ML IJ SOLN
10.0000 mg | Freq: Once | INTRAMUSCULAR | Status: AC
  Administered 2018-11-19: 10 mg via INTRAVENOUS
  Filled 2018-11-19: qty 2

## 2018-11-19 MED ORDER — KETOROLAC TROMETHAMINE 30 MG/ML IJ SOLN
30.0000 mg | Freq: Once | INTRAMUSCULAR | Status: AC
  Administered 2018-11-19: 30 mg via INTRAVENOUS
  Filled 2018-11-19: qty 1

## 2018-11-19 MED ORDER — SODIUM CHLORIDE 0.9 % IV BOLUS
1000.0000 mL | Freq: Once | INTRAVENOUS | Status: AC
  Administered 2018-11-19: 1000 mL via INTRAVENOUS

## 2018-11-19 NOTE — ED Triage Notes (Signed)
Pt arrived with complaints of abdominal pain that started about a week ago, states she has been having diarrhea.

## 2018-11-19 NOTE — Discharge Instructions (Signed)
Your symptoms are likely due to a viral illness.  You have a coronavirus test pending which should result in 48 to 72 hours.  Follow-up on the results of this test and continue to quarantine until they result.  Drink plenty of fluids to prevent dehydration.  You have been prescribed ibuprofen to take for pain, body aches, abdominal discomfort.  This can be supplemented with Bentyl for ongoing abdominal pain.  Use Zofran for management of nausea.  Follow-up with a primary care doctor.

## 2018-11-19 NOTE — ED Provider Notes (Signed)
[facility] DEPT Provider Note   CSN: [id number] Arrival date & time: 11/18/18  2347    History   Chief Complaint Chief Complaint  Patient presents with  . Abdominal Pain  . Emesis    HPI Anna Nixon is a 23 y.o. female.     23 year old female presents to the emergency department for complaints of abdominal pain.  She has been experiencing abdominal pain x1 week.  States pain is constant, sharp, migratory.  It is been present mostly in her epigastrium and suprapubic abdomen.  Symptoms accompanied by diarrhea shortly after onset.  She last experienced watery diarrhea this morning.  Has been nauseous at times with a mild sore throat.  Denies taking any medications prior to arrival for symptoms.  No fevers, inability to swallow or drooling, vomiting, melena, hematochezia, dysuria or hematuria.  No history of abdominal surgeries.  Hx of recent COVID+ exposure (sister).  The history is provided by the patient. No language interpreter was used.  Abdominal Pain Associated symptoms: vomiting   Emesis Associated symptoms: abdominal pain     Past Medical History:  Diagnosis Date  . Allergy   . Asthma   . Depression   . Headache   . Insomnia    pt takes clonidine for insomnia    Patient Active Problem List   Diagnosis Date Noted  . HNP (herniated nucleus pulposus), lumbar 05/15/2017    Past Surgical History:  Procedure Laterality Date  . LUMBAR LAMINECTOMY/DECOMPRESSION MICRODISCECTOMY N/A 05/15/2017   Procedure: MICRODISCECTOMY LEFT POSSIBLE BILATERAL LUMBAR FIVE - SACRAL ONE  LAMINOTOMY;  Surgeon: Consuella Lose, MD;  Location: [facility];  Service: Neurosurgery;  Laterality: N/A;  . WISDOM TOOTH EXTRACTION    . WISDOM TOOTH EXTRACTION       OB History   No obstetric history on file.      Home Medications    Prior to Admission medications   Medication Sig Start Date End Date Taking? Authorizing Provider  beclomethasone (QVAR) 40  MCG/ACT inhaler Inhale 2 puffs into the lungs daily as needed (shortness of breath).    Yes [provider]  dicyclomine (BENTYL) 20 MG tablet Take 1 tablet (20 mg total) by mouth every 12 (twelve) hours as needed (for abdominal pain/cramping). 11/19/18   Antonietta Breach, PA-C  ibuprofen (ADVIL) 600 MG tablet Take 1 tablet (600 mg total) by mouth every 6 (six) hours as needed for mild pain or moderate pain. 11/19/18   Antonietta Breach, PA-C  methocarbamol (ROBAXIN) 500 MG tablet Take 1 tablet (500 mg total) by mouth every 8 (eight) hours as needed for muscle spasms. Patient not taking: Reported on 11/19/2018 05/15/17   Consuella Lose, MD  ondansetron (ZOFRAN ODT) 4 MG disintegrating tablet Take 1 tablet (4 mg total) by mouth every 8 (eight) hours as needed for nausea or vomiting. 11/19/18   Antonietta Breach, PA-C  oxyCODONE 10 MG TABS Take 1 tablet (10 mg total) by mouth every 3 (three) hours as needed for severe pain ((score 7 to 10)). Patient not taking: Reported on 11/19/2018 05/15/17   Consuella Lose, MD    Family History Family History  Problem Relation Age of Onset  . Hyperlipidemia Mother   . Hypertension Mother   . Diabetes Other   . Hypertension Maternal Grandmother   . Hyperlipidemia Maternal Grandmother     Social History Social History   Tobacco Use  . Smoking status: Passive Smoke Exposure - Never Smoker  . Smokeless tobacco: Never Used  Substance  Use Topics  . Alcohol use: No  . Drug use: No     Allergies   Patient has no known allergies.   Review of Systems Review of Systems  Gastrointestinal: Positive for abdominal pain and vomiting.  Ten systems reviewed and are negative for acute change, except as noted in the HPI.    Physical Exam Updated Vital Signs BP 131/76   Pulse 86   Temp 98.6 F (37 C) (Oral)   Resp 17   SpO2 100%   Physical Exam Vitals signs and nursing note reviewed.  Constitutional:      General: She is not in acute distress.    Appearance:  She is well-developed. She is not diaphoretic.     Comments: Nontoxic appearing and in NAD. Morbidly obese.  HENT:     Head: Normocephalic and atraumatic.  Eyes:     General: No scleral icterus.    Conjunctiva/sclera: Conjunctivae normal.  Neck:     Musculoskeletal: Normal range of motion.  Cardiovascular:     Rate and Rhythm: Normal rate and regular rhythm.     Pulses: Normal pulses.  Pulmonary:     Effort: Pulmonary effort is normal. No respiratory distress.     Comments: Respirations even and unlabored Abdominal:     Palpations: Abdomen is soft. There is no mass.     Tenderness: There is no guarding.     Comments: Abdomen is soft, obese.  No palpable masses or peritoneal signs.  There is minimal discomfort upon palpation of the epigastrium and suprapubic abdomen.  Musculoskeletal: Normal range of motion.  Skin:    General: Skin is warm and dry.     Coloration: Skin is not pale.     Findings: No erythema or rash.  Neurological:     Mental Status: She is alert and oriented to person, place, and time.     Coordination: Coordination normal.  Psychiatric:        Behavior: Behavior normal.      ED Treatments / Results  Labs (all labs ordered are listed, but only abnormal results are displayed) Labs Reviewed  COMPREHENSIVE METABOLIC PANEL - Abnormal; Notable for the following components:      Result Value   Total Bilirubin 0.2 (*)    All other components within normal limits  CBC - Abnormal; Notable for the following components:   Hemoglobin 11.2 (*)    MCV 76.2 (*)    MCH 23.4 (*)    RDW 18.0 (*)    Platelets 555 (*)    All other components within normal limits  URINALYSIS, ROUTINE W REFLEX MICROSCOPIC - Abnormal; Notable for the following components:   APPearance HAZY (*)    Leukocytes,Ua LARGE (*)    Bacteria, UA RARE (*)    All other components within normal limits  NOVEL CORONAVIRUS, NAA (HOSPITAL ORDER, SEND-OUT TO REF LAB)  LIPASE, BLOOD  I-STAT BETA HCG  BLOOD, ED ([facility], [facility], [facility] ONLY)    EKG None  Radiology No results found.  Procedures Procedures (including critical care time)  Medications Ordered in ED Medications  ketorolac (TORADOL) 30 MG/ML injection 30 mg (30 mg Intravenous Given 11/19/18 0407)  sodium chloride 0.9 % bolus 1,000 mL (0 mLs Intravenous Stopped 11/19/18 0537)  metoCLOPramide (REGLAN) injection 10 mg (10 mg Intravenous Given 11/19/18 0407)    5:27 AM Patient tolerating PO water. States her pain has resolved. Is currently feeling "great".  Discussed with patient her reassuring laboratory evaluation.  I do not feel  that she requires emergent imaging at this time.  Plan for discharge with prescriptions for symptomatic management.  She will need to follow-up on her COVID test.   Initial Impression / Assessment and Plan / ED Course  I have reviewed the triage vital signs and the nursing notes.  Pertinent labs & imaging results that were available during my care of the patient were reviewed by me and considered in my medical decision making (see chart for details).        23 year old female presents to the emergency department for a myriad of symptoms consistent with likely viral etiology.  Complains of abdominal pain, but without focal tenderness on exam.  No peritoneal signs.  Her laboratory evaluation is reassuring.  She is afebrile without leukocytosis.  Has had contact with her sister who tested positive for coronavirus.  Question whether her symptoms may be reflective of active COVID infection.  She has had symptomatic improvement following IV fluids, Reglan, Toradol.  Patient advised to quarantine until she obtains the results of her COVID test.  Return precautions discussed and provided. Patient discharged in stable condition with no unaddressed concerns.  Sophronia Orlowski was evaluated in Emergency Department on 11/19/2018 for the symptoms described in the history of present illness. She was evaluated in the context of the  global COVID-19 pandemic, which necessitated consideration that the patient might be at risk for infection with the SARS-CoV-2 virus that causes COVID-19. Institutional protocols and algorithms that pertain to the evaluation of patients at risk for COVID-19 are in a state of rapid change based on information released by regulatory bodies including the CDC and federal and state organizations. These policies and algorithms were followed during the patient's care in the ED.   Final Clinical Impressions(s) / ED Diagnoses   Final diagnoses:  Generalized abdominal pain  Viral illness  Exposure to Covid-19 Virus    ED Discharge Orders         Ordered    ondansetron (ZOFRAN ODT) 4 MG disintegrating tablet  Every 8 hours PRN     11/19/18 0525    ibuprofen (ADVIL) 600 MG tablet  Every 6 hours PRN     11/19/18 0525    dicyclomine (BENTYL) 20 MG tablet  Every 12 hours PRN     11/19/18 0525           Antonietta Breach, PA-C 11/19/18 0618    Fatima Blank, MD 11/22/18 [phone]

## 2018-11-21 LAB — NOVEL CORONAVIRUS, NAA (HOSP ORDER, SEND-OUT TO REF LAB; TAT 18-24 HRS): SARS-CoV-2, NAA: DETECTED — AB

## 2021-12-28 ENCOUNTER — Emergency Department (HOSPITAL_COMMUNITY): Payer: Self-pay

## 2021-12-28 ENCOUNTER — Encounter (HOSPITAL_COMMUNITY): Payer: Self-pay

## 2021-12-28 ENCOUNTER — Emergency Department (HOSPITAL_COMMUNITY)
Admission: EM | Admit: 2021-12-28 | Discharge: 2021-12-28 | Disposition: A | Discharge status: Home / Self Care | Payer: Self-pay | Attending: Emergency Medicine | Admitting: Emergency Medicine

## 2021-12-28 ENCOUNTER — Other Ambulatory Visit: Payer: Self-pay

## 2021-12-28 DIAGNOSIS — R519 Headache, unspecified: Secondary | ICD-10-CM | POA: Insufficient documentation

## 2021-12-28 DIAGNOSIS — M79671 Pain in right foot: Secondary | ICD-10-CM | POA: Insufficient documentation

## 2021-12-28 DIAGNOSIS — R110 Nausea: Secondary | ICD-10-CM | POA: Insufficient documentation

## 2021-12-28 DIAGNOSIS — J45909 Unspecified asthma, uncomplicated: Secondary | ICD-10-CM | POA: Insufficient documentation

## 2021-12-28 DIAGNOSIS — M25561 Pain in right knee: Secondary | ICD-10-CM | POA: Insufficient documentation

## 2021-12-28 LAB — CBC WITH DIFFERENTIAL/PLATELET
Abs Immature Granulocytes: 0.02 10*3/uL (ref 0.00–0.07)
Basophils Absolute: 0 10*3/uL (ref 0.0–0.1)
Basophils Relative: 0 %
Eosinophils Absolute: 0.2 10*3/uL (ref 0.0–0.5)
Eosinophils Relative: 3 %
HCT: 36.7 % (ref 36.0–46.0)
Hemoglobin: 11.5 g/dL — ABNORMAL LOW (ref 12.0–15.0)
Immature Granulocytes: 0 %
Lymphocytes Relative: 38 %
Lymphs Abs: 2.9 10*3/uL (ref 0.7–4.0)
MCH: 24 pg — ABNORMAL LOW (ref 26.0–34.0)
MCHC: 31.3 g/dL (ref 30.0–36.0)
MCV: 76.5 fL — ABNORMAL LOW (ref 80.0–100.0)
Monocytes Absolute: 0.8 10*3/uL (ref 0.1–1.0)
Monocytes Relative: 10 %
Neutro Abs: 3.6 10*3/uL (ref 1.7–7.7)
Neutrophils Relative %: 49 %
Platelets: 477 10*3/uL — ABNORMAL HIGH (ref 150–400)
RBC: 4.8 MIL/uL (ref 3.87–5.11)
RDW: 17 % — ABNORMAL HIGH (ref 11.5–15.5)
WBC: 7.6 10*3/uL (ref 4.0–10.5)
nRBC: 0 % (ref 0.0–0.2)

## 2021-12-28 LAB — BASIC METABOLIC PANEL
Anion gap: 7 (ref 5–15)
BUN: 9 mg/dL (ref 6–20)
CO2: 26 mmol/L (ref 22–32)
Calcium: 8.7 mg/dL — ABNORMAL LOW (ref 8.9–10.3)
Chloride: 105 mmol/L (ref 98–111)
Creatinine, Ser: 0.5 mg/dL (ref 0.44–1.00)
GFR, Estimated: >60 mL/min (ref >60)
Glucose, Bld: 99 mg/dL (ref 70–99)
Potassium: 4.1 mmol/L (ref 3.5–5.1)
Sodium: 138 mmol/L (ref 135–145)

## 2021-12-28 LAB — I-STAT BETA HCG BLOOD, ED (MC, WL, AP ONLY): I-stat hCG, quantitative: <5 m[IU]/mL (ref <5)

## 2021-12-28 LAB — URINALYSIS, ROUTINE W REFLEX MICROSCOPIC
Bilirubin Urine: NEGATIVE
Glucose, UA: NEGATIVE mg/dL
Hgb urine dipstick: NEGATIVE
Ketones, ur: NEGATIVE mg/dL
Nitrite: NEGATIVE
Protein, ur: NEGATIVE mg/dL
Specific Gravity, Urine: 1.023 (ref 1.005–1.030)
pH: 5 (ref 5.0–8.0)

## 2021-12-28 NOTE — ED Provider Triage Note (Signed)
Emergency Medicine Provider Triage Evaluation Note  Anna Nixon , a 26 y.o. female  was evaluated in triage.  Pt complains of multiple complaints. Intermittent gradual right knee and right ankle pain without injury. Intermittent headaches with intermittent nausea. Intermittent midline chest pain without aggravating factors, goes away on its own. Has not taken anything for her symptoms as she does not like taking medicine. No fevers, chills. Hx of asthma and ADHD  Review of Systems  Positive: As above Negative: Fevers, chills, SOB, cough  Physical Exam  BP (!) 151/90 (BP Location: Right Arm)   Pulse 84   Temp 97.9 F (36.6 C) (Oral)   Resp 18   Ht 5\' 9"  (1.753 m)   Wt (!) 149.2 kg   SpO2 100%   BMI 48.58 kg/m  Gen:   Awake, no distress   Resp:  Normal effort  MSK:   Moves extremities without difficulty  Other:  Ambulates without difficulty  Medical Decision Making  Medically screening exam initiated at 9:07 AM.  Appropriate orders placed.  Anna Nixon was informed that the remainder of the evaluation will be completed by another provider, this initial triage assessment does not replace that evaluation, and the importance of remaining in the ED until their evaluation is complete.    Anna Steinhardt T, PA-C 12/28/21 [phone]

## 2021-12-28 NOTE — ED Provider Notes (Signed)
[facility] EMERGENCY DEPARTMENT Provider Note   CSN: [id number] Arrival date & time: 12/28/21  0844     History  Chief Complaint  Patient presents with   Knee Pain    Anna Nixon is a 26 y.o. female with history of asthma and elective back surgery presenting for right knee and foot pain. She reports no acute injury but that the pain has been there for a while and has been getting worse. The pain is worse when she is standing or sitting for long periods of time. She is also concerned that her knee pops when she does any type of squatting, which is also uncomfortable. She has no history of injury previously to either area. She wears poor foot support with sandals.   Patient also intermittently having headaches with no neurologic changes and no history of migraines. Is able to treat with OTC medications.   Patient reports intermittent nausea which is sometimes followed by chest discomfort. She is unaware of any history of reflux. Symptoms are not always present and occur every few days on average. She has no other associated symptoms.   Patient also concerned about her back. She notes an elective surgery in 2019 but she still has pain sometimes with laying back flat. This is not an urgent concern for her and has not changed since 2019.   Knee Pain Associated symptoms: no fatigue and no fever        Home Medications Prior to Admission medications   Medication Sig Start Date End Date Taking? Authorizing Provider  beclomethasone (QVAR) 40 MCG/ACT inhaler Inhale 2 puffs into the lungs daily as needed (shortness of breath).     [provider]  dicyclomine (BENTYL) 20 MG tablet Take 1 tablet (20 mg total) by mouth every 12 (twelve) hours as needed (for abdominal pain/cramping). 11/19/18   Antony Madura, PA-C  ibuprofen (ADVIL) 600 MG tablet Take 1 tablet (600 mg total) by mouth every 6 (six) hours as needed for mild pain or moderate pain. 11/19/18   Antony Madura,  PA-C  methocarbamol (ROBAXIN) 500 MG tablet Take 1 tablet (500 mg total) by mouth every 8 (eight) hours as needed for muscle spasms. Patient not taking: Reported on 11/19/2018 05/15/17   Lisbeth Renshaw, MD  ondansetron (ZOFRAN ODT) 4 MG disintegrating tablet Take 1 tablet (4 mg total) by mouth every 8 (eight) hours as needed for nausea or vomiting. 11/19/18   Antony Madura, PA-C  oxyCODONE 10 MG TABS Take 1 tablet (10 mg total) by mouth every 3 (three) hours as needed for severe pain ((score 7 to 10)). Patient not taking: Reported on 11/19/2018 05/15/17   Lisbeth Renshaw, MD      Allergies    Patient has no known allergies.    Review of Systems   Review of Systems  Constitutional:  Negative for activity change, appetite change, fatigue and fever.  HENT:  Negative for congestion and trouble swallowing.   Eyes:  Negative for visual disturbance.  Respiratory:  Negative for chest tightness and shortness of breath.   Cardiovascular:  Negative for chest pain, palpitations and leg swelling.  Gastrointestinal:  Negative for abdominal distention, abdominal pain, constipation, diarrhea, nausea and vomiting.  Genitourinary:  Negative for decreased urine volume and difficulty urinating.  Musculoskeletal:  Negative for arthralgias.       Pain in Right knee and right foot  Neurological:  Negative for dizziness, syncope and weakness.    Physical Exam Updated Vital Signs BP 139/74 (BP  Location: Right Arm)   Pulse 77   Temp 98.4 F (36.9 C) (Oral)   Resp 19   Ht 5\' 9"  (1.753 m)   Wt (!) 149.2 kg   SpO2 99%   BMI 48.58 kg/m  Physical Exam Constitutional:      Appearance: She is obese.  HENT:     Head: Normocephalic and atraumatic.     Nose: Nose normal.     Mouth/Throat:     Mouth: Mucous membranes are moist.  Eyes:     Extraocular Movements: Extraocular movements intact.     Conjunctiva/sclera: Conjunctivae normal.     Pupils: Pupils are equal, round, and reactive to light.   Cardiovascular:     Rate and Rhythm: Normal rate and regular rhythm.     Pulses: Normal pulses.     Heart sounds: Normal heart sounds.  Pulmonary:     Effort: Pulmonary effort is normal.     Breath sounds: Normal breath sounds.  Abdominal:     General: Abdomen is flat. Bowel sounds are normal.     Palpations: Abdomen is soft.     Tenderness: There is no abdominal tenderness. There is no rebound.  Musculoskeletal:     Cervical back: Normal range of motion and neck supple.     Right knee: No swelling or bony tenderness. Decreased range of motion (secondary to pain with lifting the knee passively). No tenderness (to palpation when knee is actively engaged). No LCL laxity, MCL laxity, ACL laxity or PCL laxity.     Instability Tests: Anterior drawer test negative. Posterior drawer test negative.     Left knee: No swelling or bony tenderness. Normal range of motion. No tenderness.  Skin:    General: Skin is warm and dry.     Capillary Refill: Capillary refill takes less than 2 seconds.  Neurological:     General: No focal deficit present.     Mental Status: She is alert.     ED Results / Procedures / Treatments   Labs (all labs ordered are listed, but only abnormal results are displayed) Labs Reviewed  CBC WITH DIFFERENTIAL/PLATELET - Abnormal; Notable for the following components:      Result Value   Hemoglobin 11.5 (*)    MCV 76.5 (*)    MCH 24.0 (*)    RDW 17.0 (*)    Platelets 477 (*)    All other components within normal limits  BASIC METABOLIC PANEL - Abnormal; Notable for the following components:   Calcium 8.7 (*)    All other components within normal limits  URINALYSIS, ROUTINE W REFLEX MICROSCOPIC - Abnormal; Notable for the following components:   APPearance HAZY (*)    Leukocytes,Ua LARGE (*)    Bacteria, UA RARE (*)    All other components within normal limits  I-STAT BETA HCG BLOOD, ED ([facility], [facility], [facility] ONLY)    EKG None  Radiology DG Ankle Complete  Right  Result Date: 12/28/2021 CLINICAL DATA:  Medial malleolar right ankle pain for 3 weeks EXAM: RIGHT ANKLE - COMPLETE 3+ VIEW COMPARISON:  None Available. FINDINGS: Frontal, oblique, and lateral views of the right ankle are obtained. No acute displaced fracture, subluxation, or dislocation. Joint spaces of the right ankle are well preserved. Mild midfoot osteoarthritis. Soft tissues are grossly normal. IMPRESSION: 1. Unremarkable right ankle. 2. Mild midfoot osteoarthritis. Electronically Signed   By: 12/30/2021 M.D.   On: 12/28/2021 09:34   DG Knee Complete 4 Views Right  Result Date:  12/28/2021 CLINICAL DATA:  Right patellar pain for 3 weeks EXAM: RIGHT KNEE - COMPLETE 4+ VIEW COMPARISON:  07/12/2009 FINDINGS: Frontal, bilateral oblique, lateral views of the right knee are obtained. No fracture, subluxation, or dislocation. Joint spaces are well preserved. No joint effusion. Soft tissues are unremarkable. IMPRESSION: 1. Unremarkable right knee. Electronically Signed   By: Sharlet Salina M.D.   On: 12/28/2021 09:32    Procedures Procedures    Medications Ordered in ED Medications - No data to display  ED Course/ Medical Decision Making/ A&P                           Medical Decision Making Amount and/or Complexity of Data Reviewed Labs: ordered. Radiology: ordered.   Anna Nixon is a 26 y.o. female that presented for right knee and foot pain. Patient has no acute injury and has been worsening for some time.  X-rays were negative for acute fractures, though did note some mild arthritis of the right mid-foot.  She has poor footwear and feel that that may be contributing somewhat to the pain in her foot and knee. Knee physical exam without concern for an acute injury that needs intervention. Consideration for patellofemoral syndrome. Do not feel further work-up in the ER is necessary at this time. Patient instructed to follow-up with sports medicine for further evaluation and  treatment. NSAID and ice use as needed for pain, patient also given some instructions regarding exercises.   Patient also mentioned back pain with laying that has persistent since elective neurosurgery procedure in 2019. She has no acute concerns and no red flag signs on examination. Deferred treatment to outpatient. Recommended patient follow-up with neurosurgery if genuine concerns regarding the surgery or could start with exercises and rehabilitation movements to improve core and back strength as well as weight loss.   Patient also mentioned intermittent nausea and chest discomfort as well as headache. Doesn't occur every day and labs were normal. Negative pregnancy test. Physical exam unremarkable. Given description of symptoms, feel possibly related to reflux. Recommended OTC treatment with PPI as patient does not have insurance and discussed return precautions. Was not able to trial out in the ER as patient was not symptomatic.    Final Clinical Impression(s) / ED Diagnoses Final diagnoses:  Acute pain of right knee  Nausea  Acute nonintractable headache, unspecified headache type    Rx / DC Orders ED Discharge Orders     None         Evelena Leyden, DO 12/28/21 1315    Gerhard Munch, MD 12/28/21 1549

## 2021-12-28 NOTE — Discharge Instructions (Addendum)
Your mid-foot on the right side did show some very mild arthritis. I recommend getting a good tennis shoe with support. Your knee x-ray was negative for any fracture so it would be okay to have you evaluated by sports medicine at follow-up. You can do tylenol and Ibuprofen for the pain.  I also recommend doing exercises to strengthen your quadriceps, gluteus muscles and your core. I have provided some examples but feel free to look on the Internet for further assistance.   Regarding your nausea and chest discomfort afterwards, this could possibly be related to reflux and I recommend trying the over the counter Omeprazole or Pantoprazole and see if that helps your symptoms.

## 2021-12-28 NOTE — ED Triage Notes (Signed)
Complains of right knee and right ankle pain without injury and also having headaches x [redacted] weeks along with nausea.  Only hx is asthma and adhd. Reports it is painful to walk on right leg.

## 2022-11-05 ENCOUNTER — Ambulatory Visit: Payer: Self-pay | Admitting: Family Medicine

## 2024-02-17 ENCOUNTER — Inpatient Hospital Stay: Payer: Self-pay | Attending: Hematology and Oncology | Admitting: Hematology and Oncology

## 2024-02-17 ENCOUNTER — Inpatient Hospital Stay: Payer: Self-pay

## 2024-03-17 ENCOUNTER — Telehealth: Payer: Self-pay

## 2024-03-17 NOTE — Telephone Encounter (Signed)
 Spoke with patient and confirmed appointment on 11/6

## 2024-03-18 ENCOUNTER — Inpatient Hospital Stay: Payer: Self-pay | Attending: Hematology and Oncology | Admitting: Hematology and Oncology

## 2024-03-18 ENCOUNTER — Inpatient Hospital Stay: Payer: Self-pay

## 2024-03-18 VITALS — BP 152/69 | HR 85 | Temp 98.0°F | Resp 17 | Wt >= 6400 oz

## 2024-03-18 DIAGNOSIS — D509 Iron deficiency anemia, unspecified: Secondary | ICD-10-CM

## 2024-03-18 LAB — CBC WITH DIFFERENTIAL/PLATELET
Abs Immature Granulocytes: 0.02 K/uL (ref 0.00–0.07)
Basophils Absolute: 0 K/uL (ref 0.0–0.1)
Basophils Relative: 0 %
Eosinophils Absolute: 0.2 K/uL (ref 0.0–0.5)
Eosinophils Relative: 2 %
HCT: 33.8 % — ABNORMAL LOW (ref 36.0–46.0)
Hemoglobin: 10.9 g/dL — ABNORMAL LOW (ref 12.0–15.0)
Immature Granulocytes: 0 %
Lymphocytes Relative: 31 %
Lymphs Abs: 2.5 K/uL (ref 0.7–4.0)
MCH: 23.9 pg — ABNORMAL LOW (ref 26.0–34.0)
MCHC: 32.2 g/dL (ref 30.0–36.0)
MCV: 74.1 fL — ABNORMAL LOW (ref 80.0–100.0)
Monocytes Absolute: 0.9 K/uL (ref 0.1–1.0)
Monocytes Relative: 10 %
Neutro Abs: 4.7 K/uL (ref 1.7–7.7)
Neutrophils Relative %: 57 %
Platelets: 595 K/uL — ABNORMAL HIGH (ref 150–400)
RBC: 4.56 MIL/uL (ref 3.87–5.11)
RDW: 17.3 % — ABNORMAL HIGH (ref 11.5–15.5)
WBC: 8.3 K/uL (ref 4.0–10.5)
nRBC: 0 % (ref 0.0–0.2)

## 2024-03-18 LAB — IRON AND IRON BINDING CAPACITY (CC-WL,HP ONLY)
Iron: 35 ug/dL (ref 28–170)
Saturation Ratios: 8 % — ABNORMAL LOW (ref 10.4–31.8)
TIBC: 423 ug/dL (ref 250–450)
UIBC: 388 ug/dL (ref 148–442)

## 2024-03-18 LAB — FERRITIN: Ferritin: 128 ng/mL (ref 11–307)

## 2024-03-18 NOTE — Progress Notes (Signed)
 [facility] CONSULT NOTE  Patient Care Team: Patient, No Pcp Per as PCP - General (General Practice)  CHIEF COMPLAINTS/PURPOSE OF CONSULTATION:  Mild anemia  ASSESSMENT & PLAN:  Assessment & Plan Suspected iron deficiency anemia Mild anemia with microcytic red blood cells and slightly elevated platelets suggest iron deficiency anemia. Recent labs show mildly low hemoglobin. - Ordered iron studies to confirm iron deficiency anemia. - If confirmed, initiate iron supplementation.  Ongoing nausea and dizziness Experiencing nausea and dizziness for three weeks, predating birth control initiation. I dont believe this is related to the anemia, especially since this was mild back in August She should continue to monitor and discuss with PCP if this worsens.  Migraine Chronic migraines with recent exacerbation. Symptoms include nausea and dizziness, possibly overlapping with migraine episodes.  Menstrual irregularity Irregular cycles with history of amenorrhea, likely weight-related. Recently started birth control for regulation.  Prediabetes On Metformin.   HISTORY OF PRESENTING ILLNESS:  Anna Nixon 28 y.o. female is here because of anemia.  Discussed the use of AI scribe software for clinical note transcription with the patient, who gave verbal consent to proceed.  History of Present Illness Anna Nixon is a 28 year old female who presents for hematology consultation regarding mild anemia. She was referred by her primary care physician for evaluation of mild anemia.  She has mild anemia and has not been on any iron supplements. She is unsure if iron levels were previously checked. Her hemoglobin was noted to be mildly low with microcytic red blood cells and slightly elevated platelets in past labs from July and August. No family history of blood disorders and no unusual cravings such as ice or starch.  She has a history of irregular menstrual cycles and recently  started birth control to regulate her hormones. She went about four years without a period, which was attributed to her weight by a doctor when she was 22. She had a period last month after starting birth control.  She experiences lightheadedness and nausea for the past three weeks, which began before starting birth control. She denies any recent sexual activity and is certain she is not pregnant. She also suffers from chronic migraines but does not take medication for them.  Her past medical history includes asthma, for which she used inhalers in the past but not recently, and back surgery in 2019. She takes metformin for prediabetes and is currently on medication for a vaginal infection.  She works at a [employer] and recently started using her insurance to [employer]. No shortness of breath, syncopal attacks, or unusual cravings. Reports lightheadedness, nausea, and chronic migraines.   All other systems were reviewed with the patient and are negative.  MEDICAL HISTORY:  Past Medical History:  Diagnosis Date   Allergy    Asthma    Depression    Headache    Insomnia    pt takes clonidine  for insomnia    SURGICAL HISTORY: Past Surgical History:  Procedure Laterality Date   LUMBAR LAMINECTOMY/DECOMPRESSION MICRODISCECTOMY N/A 05/15/2017   Procedure: MICRODISCECTOMY LEFT POSSIBLE BILATERAL LUMBAR FIVE - SACRAL ONE  LAMINOTOMY;  Surgeon: Lanis Pupa, MD;  Location: [facility] OR;  Service: Neurosurgery;  Laterality: N/A;   WISDOM TOOTH EXTRACTION     WISDOM TOOTH EXTRACTION      SOCIAL HISTORY: Social History   Socioeconomic History   Marital status: Single    Spouse name: Not on file   Number of children: Not on file  Years of education: Not on file   Highest education level: Not on file  Occupational History   Not on file  Tobacco Use   Smoking status: Passive Smoke Exposure - Never Smoker   Smokeless tobacco: Never  Vaping Use   Vaping status: Never Used   Substance and Sexual Activity   Alcohol use: No   Drug use: No   Sexual activity: Not on file  Other Topics Concern   Not on file  Social History Narrative   Not on file   Social Drivers of Health   Financial Resource Strain: Not on File (08/30/2021)   Received from [employer]    Financial Resource Strain: 0  Food Insecurity: Food Insecurity Present (03/18/2024)   Hunger Vital Sign    Worried About Running Out of Food in the Last Year: Sometimes true    Ran Out of Food in the Last Year: Sometimes true  Transportation Needs: Not on File (08/30/2021)   Received from [employer] Needs    Transportation: 0  Physical Activity: Not on File (08/30/2021)   Received from [facility]   Physical Activity    Physical Activity: 0  Stress: Not on File (08/30/2021)   Received [facility]   Stress    Stress: 0  Social Connections: Not on File (01/25/2023)   Received from [employer]   Social Connections    Connectedness: 0  Intimate Partner Violence: Not on file    FAMILY HISTORY: Family History  Problem Relation Age of Onset   Hyperlipidemia Mother    Hypertension Mother    Diabetes Other    Hypertension Maternal Grandmother    Hyperlipidemia Maternal Grandmother     ALLERGIES:  has no known allergies.  MEDICATIONS:  Current Outpatient Medications  Medication Sig Dispense Refill   beclomethasone (QVAR) 40 MCG/ACT inhaler Inhale 2 puffs into the lungs daily as needed (shortness of breath).      dicyclomine  (BENTYL ) 20 MG tablet Take 1 tablet (20 mg total) by mouth every 12 (twelve) hours as needed (for abdominal pain/cramping). 14 tablet 0   ibuprofen  (ADVIL ) 600 MG tablet Take 1 tablet (600 mg total) by mouth every 6 (six) hours as needed for mild pain or moderate pain. 30 tablet 0   methocarbamol  (ROBAXIN ) 500 MG tablet Take 1 tablet (500 mg total) by mouth every 8 (eight) hours as needed for muscle spasms. (Patient not taking: Reported on 11/19/2018) 60  tablet 0   ondansetron  (ZOFRAN  ODT) 4 MG disintegrating tablet Take 1 tablet (4 mg total) by mouth every 8 (eight) hours as needed for nausea or vomiting. 10 tablet 0   oxyCODONE  10 MG TABS Take 1 tablet (10 mg total) by mouth every 3 (three) hours as needed for severe pain ((score 7 to 10)). (Patient not taking: Reported on 11/19/2018) 50 tablet 0   No current facility-administered medications for this visit.     PHYSICAL EXAMINATION: ECOG PERFORMANCE STATUS: 0 - Asymptomatic  Vitals:   03/18/24 1450  BP: (!) 152/69  Pulse: 85  Resp: 17  Temp: 98 F (36.7 C)  SpO2: 99%   Filed Weights   03/18/24 1450  Weight: (!) 400 lb 1.6 oz (181.5 kg)    GENERAL:alert, no distress and comfortable, obese EYES: normal, conjunctiva are pink and non-injected, sclera clear NECK: supple, thyroid normal size, non-tender, without nodularity LYMPH:  no palpable lymphadenopathy in the cervical, axillary  LUNGS: clear to auscultation and percussion with normal breathing effort  HEART: regular rate & rhythm and no murmurs and no lower extremity edema ABDOMEN:abdomen soft, non-tender  Musculoskeletal:no cyanosis of digits and no clubbing  PSYCH: alert & oriented x 3 with fluent speech NEURO: no focal motor/sensory deficits  LABORATORY DATA:  I have reviewed the data as listed Lab Results  Component Value Date   WBC 7.6 12/28/2021   HGB 11.5 (L) 12/28/2021   HCT 36.7 12/28/2021   MCV 76.5 (L) 12/28/2021   PLT 477 (H) 12/28/2021     Chemistry      Component Value Date/Time   NA 138 12/28/2021 0902   K 4.1 12/28/2021 0902   CL 105 12/28/2021 0902   CO2 26 12/28/2021 0902   BUN 9 12/28/2021 0902   CREATININE 0.50 12/28/2021 0902      Component Value Date/Time   CALCIUM 8.7 (L) 12/28/2021 0902   ALKPHOS 70 11/19/2018 0408   AST 25 11/19/2018 0408   ALT 14 11/19/2018 0408   BILITOT 0.2 (L) 11/19/2018 0408       RADIOGRAPHIC STUDIES: I have personally reviewed the radiological images  as listed and agreed with the findings in the report. No results found.  All questions were answered. The patient knows to call the clinic with any problems, questions or concerns. I spent 45 minutes in the care of this patient including H and P, review of records, counseling and coordination of care.     Amber Stalls, MD 03/18/2024 3:11 PM

## 2024-06-07 ENCOUNTER — Emergency Department (HOSPITAL_COMMUNITY)

## 2024-06-07 ENCOUNTER — Other Ambulatory Visit: Payer: Self-pay

## 2024-06-07 ENCOUNTER — Encounter (HOSPITAL_COMMUNITY): Payer: Self-pay

## 2024-06-07 ENCOUNTER — Emergency Department (HOSPITAL_COMMUNITY)
Admission: EM | Admit: 2024-06-07 | Discharge: 2024-06-08 | Disposition: A | Attending: Emergency Medicine | Admitting: Emergency Medicine

## 2024-06-07 DIAGNOSIS — D72829 Elevated white blood cell count, unspecified: Secondary | ICD-10-CM | POA: Insufficient documentation

## 2024-06-07 DIAGNOSIS — F1990 Other psychoactive substance use, unspecified, uncomplicated: Secondary | ICD-10-CM | POA: Insufficient documentation

## 2024-06-07 LAB — I-STAT ARTERIAL BLOOD GAS, ED
Acid-base deficit: 1 mmol/L (ref 0.0–2.0)
Bicarbonate: 23 mmol/L (ref 20.0–28.0)
Calcium, Ion: 1.12 mmol/L — ABNORMAL LOW (ref 1.15–1.40)
HCT: 35 % — ABNORMAL LOW (ref 36.0–46.0)
Hemoglobin: 11.9 g/dL — ABNORMAL LOW (ref 12.0–15.0)
O2 Saturation: 90 %
Potassium: 3.7 mmol/L (ref 3.5–5.1)
Sodium: 141 mmol/L (ref 135–145)
TCO2: 24 mmol/L (ref 22–32)
pCO2 arterial: 35.1 mmHg (ref 32–48)
pH, Arterial: 7.423 (ref 7.35–7.45)
pO2, Arterial: 57 mmHg — ABNORMAL LOW (ref 83–108)

## 2024-06-07 LAB — CBC WITH DIFFERENTIAL/PLATELET
Abs Immature Granulocytes: 0.06 10*3/uL (ref 0.00–0.07)
Basophils Absolute: 0.1 10*3/uL (ref 0.0–0.1)
Basophils Relative: 0 %
Eosinophils Absolute: 0.3 10*3/uL (ref 0.0–0.5)
Eosinophils Relative: 3 %
HCT: 35.2 % — ABNORMAL LOW (ref 36.0–46.0)
Hemoglobin: 11.1 g/dL — ABNORMAL LOW (ref 12.0–15.0)
Immature Granulocytes: 1 %
Lymphocytes Relative: 28 %
Lymphs Abs: 3.2 10*3/uL (ref 0.7–4.0)
MCH: 23.6 pg — ABNORMAL LOW (ref 26.0–34.0)
MCHC: 31.5 g/dL (ref 30.0–36.0)
MCV: 74.9 fL — ABNORMAL LOW (ref 80.0–100.0)
Monocytes Absolute: 1 10*3/uL (ref 0.1–1.0)
Monocytes Relative: 8 %
Neutro Abs: 7 10*3/uL (ref 1.7–7.7)
Neutrophils Relative %: 60 %
Platelets: 463 10*3/uL — ABNORMAL HIGH (ref 150–400)
RBC: 4.7 MIL/uL (ref 3.87–5.11)
RDW: 17.2 % — ABNORMAL HIGH (ref 11.5–15.5)
WBC: 11.5 10*3/uL — ABNORMAL HIGH (ref 4.0–10.5)
nRBC: 0 % (ref 0.0–0.2)

## 2024-06-07 LAB — SALICYLATE LEVEL: Salicylate Lvl: 27.6 mg/dL (ref 7.0–30.0)

## 2024-06-07 LAB — CBG MONITORING, ED: Glucose-Capillary: 90 mg/dL (ref 70–99)

## 2024-06-07 LAB — PROTIME-INR
INR: 1 (ref 0.8–1.2)
Prothrombin Time: 14.3 s (ref 11.4–15.2)

## 2024-06-07 LAB — MAGNESIUM: Magnesium: 2.3 mg/dL (ref 1.7–2.4)

## 2024-06-07 LAB — HCG, SERUM, QUALITATIVE: Preg, Serum: NEGATIVE

## 2024-06-07 MED ORDER — LACTATED RINGERS IV BOLUS
20.0000 mL/kg | Freq: Once | INTRAVENOUS | Status: AC
  Administered 2024-06-07: 3692 mL via INTRAVENOUS

## 2024-06-07 MED ORDER — DEXTROSE 50 % IV SOLN
1.0000 | Freq: Once | INTRAVENOUS | Status: AC
  Administered 2024-06-07: 50 mL via INTRAVENOUS
  Filled 2024-06-07: qty 50

## 2024-06-07 MED ORDER — VITAMIN K1 1 MG/0.5ML IJ SOLN
1.0000 mg | Freq: Once | INTRAMUSCULAR | Status: DC
  Filled 2024-06-07 (×2): qty 0.5

## 2024-06-07 NOTE — ED Triage Notes (Signed)
 Pt has been dealing w/ dental pain x 4-5 days and has been taking a lot of ASA. Pt called poison control and was advised to come to ED.

## 2024-06-07 NOTE — ED Provider Notes (Signed)
 " [facility]-EMERGENCY DEPT [facility] Emergency Department Provider Note MRN:  [id number]  Arrival date & time: 06/08/24     Chief Complaint   Poisoning (Accidental)   History of Present Illness   Anna Nixon is a 29 y.o. year-old female presents to the ED with chief complaint of ASA overdose.  Patient states that she has had a bad toothache and always over does it when she takes medications.  She states that yesterday and today she has been eating aspirin like tic-tacs.  She states that yesterday she started with a bottle of 100 and has take (48) of 325mg  tabs.  States that she has also been taking BC powder with ASA in it.  She states that she feels sleepy and has been having some intermittent difficulty hearing.  History provided by patient.   Review of Systems  Pertinent positive and negative review of systems noted in HPI.    Physical Exam   Vitals:   06/08/24 0200 06/08/24 0215  BP: 127/74 121/72  Pulse: 80 82  Resp: (!) 24 20  Temp:    SpO2: 95% 100%    CONSTITUTIONAL:  non toxic-appearing, NAD NEURO:  Alert and oriented x 3, CN 3-12 grossly intact EYES:  eyes equal and reactive ENT/NECK:  Supple, no stridor  CARDIO:  normal rate, regular rhythm, appears well-perfused  PULM:  No respiratory distress, CTAB GI/GU:  non-distended, no focal tenderness MSK/SPINE:  No gross deformities, no edema, moves all extremities  SKIN:  no rash, atraumatic   *Additional and/or pertinent findings included in MDM below  Diagnostic and Interventional Summary    EKG Interpretation Date/Time:  Monday June 07 2024 23:05:51 EST Ventricular Rate:  76 PR Interval:  157 QRS Duration:  112 QT Interval:  374 QTC Calculation: 421 R Axis:   77  Text Interpretation: Sinus rhythm Borderline intraventricular conduction delay Nonspecific repol abnormality, inferior leads No significant change since last tracing Confirmed by Haze Lonni PARAS [phone]) on 06/07/2024 11:09:39 PM        Labs Reviewed  CBC WITH DIFFERENTIAL/PLATELET - Abnormal; Notable for the following components:      Result Value   WBC 11.5 (*)    Hemoglobin 11.1 (*)    HCT 35.2 (*)    MCV 74.9 (*)    MCH 23.6 (*)    RDW 17.2 (*)    Platelets 463 (*)    All other components within normal limits  ACETAMINOPHEN  LEVEL - Abnormal; Notable for the following components:   Acetaminophen  (Tylenol ), Serum <10 (*)    All other components within normal limits  BASIC METABOLIC PANEL WITH GFR - Abnormal; Notable for the following components:   Calcium 8.5 (*)    All other components within normal limits  I-STAT ARTERIAL BLOOD GAS, ED - Abnormal; Notable for the following components:   pO2, Arterial 57 (*)    Calcium, Ion 1.12 (*)    HCT 35.0 (*)    Hemoglobin 11.9 (*)    All other components within normal limits  MAGNESIUM  PROTIME-INR  SALICYLATE LEVEL  HCG, SERUM, QUALITATIVE  URINE DRUG SCREEN  ETHANOL  SALICYLATE LEVEL  VITAMIN K1 , SERUM  CBG MONITORING, ED  CBG MONITORING, ED    DG Chest Port 1 View  Final Result      Medications  lactated ringers  bolus 3,692 mL (0 mLs Intravenous Stopped 06/08/24 0130)  dextrose  50 % solution 50 mL (50 mLs Intravenous Given 06/07/24 2241)     Procedures  /  Critical Care Procedures  ED Course and Medical Decision Making  I have reviewed the triage vital signs, the nursing notes, and pertinent available records from the EMR.  Social Determinants Affecting Complexity of Care: Patient has no clinically significant social determinants affecting this chief complaint..   ED Course: Clinical Course as of 06/08/24 0630  Mon Jun 07, 2024  2301 I discussed labs with poison control.  Fortunately, initial salicylate is in the therapeutic range.  They say that we can hold vit K.  Recheck salicylate at 3 hrs.  Continue fluids.  Ok to wait on alkalization and see what repeat labs are.  [RB]    Clinical Course User Index [RB] Vicky Charleston, PA-C     Medical Decision Making Patient presents for suspected aspirin overdose.  She states that she has taken about 48 tablets in the past 24 hours.  She states that she feels sleepy.  She reports subjective intermittent hearing loss, but does not have anything objectively.  Initial salicylate level is within the therapeutic range at 27.6.  Poison control says that we can hold vitamin K given that value.  Will repeat in 3 hours.  Magnesium is normal.  Arterial pH is normal.  PT/INR is normal.  Pregnancy test negative.  Chest x-ray without evidence of pulmonary edema.  Coingestants of acetaminophen  and ethanol are negative.  Repeat salicylate level is downtrending at 21.7.  EKG is reassuring.  At this point, Poison control states that patient can be medically cleared.  This was an accidental overdose.  I have low suspicion for self-harm behavior.  Regarding her dental pain, we will start her on amoxicillin  and have her follow-up with a dentist.  Amount and/or Complexity of Data Reviewed Labs: ordered. Radiology: ordered. ECG/medicine tests: ordered.  Risk Prescription drug management.         Consultants: I consulted with Poison Control, who helped direct care.  Ultimately cleared by [employer].   Treatment and Plan: I considered admission due to patient's initial presentation, but after considering the examination and diagnostic results, patient will not require admission and can be discharged with outpatient follow-up.    Final Clinical Impressions(s) / ED Diagnoses     ICD-10-CM   1. Misuse of drugs  F19.90       ED Discharge Orders          Ordered    amoxicillin  (AMOXIL ) 500 MG capsule  3 times daily,   Status:  Discontinued        06/08/24 0214    amoxicillin  (AMOXIL ) 500 MG capsule  3 times daily        06/08/24 0240              Discharge Instructions Discussed with and Provided to Patient:     Discharge Instructions      Please take the  occasions only as directed on the label or as prescribed by physician.  Please return for new or worsening symptoms.       Vicky Charleston, PA-C 06/08/24 [id number]    Haze Lonni PARAS, MD 06/08/24 [phone]  "

## 2024-06-07 NOTE — ED Triage Notes (Signed)
 Poison control called and advised for ASA toxicity, pt has taken multiple ASA all day and complains of difficulty hearing. Give LR bolus 20ml/kg, give vit K once either PO or sub Q, check Mg, glucose, K levels also check CBC, PT INR, ABG, and salicylate levels q3 hrs. Ensure that urine output remains at least 2cc per kg per hr. Poison control stated they would fax directions but have not at this time.

## 2024-06-07 NOTE — ED Triage Notes (Signed)
 Patient reports taking aspirin  for a toothache starting yesterday with a bottle of 100 tablets. Patient took 48 325 mg tablets in it. Patient also reports that she took 5 BC powders which contain 845 mg of aspirin each. Patient is sleepy, reports ringing in her ears and chest pain.

## 2024-06-07 NOTE — ED Provider Triage Note (Signed)
 Emergency Medicine Provider Triage Evaluation Note  Anna Nixon , a 29 y.o. female  was evaluated in triage.  Pt complains of overdose.  Patient has taken approximately 48 aspirin and 5 packs of BC powder that are 840 mg of aspirin in each over the past 48 hours.  She contacted poison control and was told to go to the ER.  She is complaining of head pain, chest pain, and ringing in her ears.  Patient also reports feeling very sleepy.  She denies any abdominal pain.  Review of Systems  Positive: Overdose Negative:   Physical Exam  BP 133/73 (BP Location: Right Arm)   Pulse 78   Temp 98.4 F (36.9 C)   Resp 18   SpO2 100%  Gen:   Awake, no distress   Resp:  Normal effort  MSK:   Moves extremities without difficulty  Other:    Medical Decision Making  Medically screening exam initiated at 9:57 PM.  Appropriate orders placed.  Anna Nixon was informed that the remainder of the evaluation will be completed by another provider, this initial triage assessment does not replace that evaluation, and the importance of remaining in the ED until their evaluation is complete.     Rosaline Almarie MATSU, [place] 06/07/24 2159

## 2024-06-08 LAB — URINE DRUG SCREEN
Amphetamines: NEGATIVE
Barbiturates: NEGATIVE
Benzodiazepines: NEGATIVE
Cocaine: NEGATIVE
Fentanyl: NEGATIVE
Methadone Scn, Ur: NEGATIVE
Opiates: NEGATIVE
Tetrahydrocannabinol: NEGATIVE

## 2024-06-08 LAB — BASIC METABOLIC PANEL WITH GFR
Anion gap: 9 (ref 5–15)
BUN: 11 mg/dL (ref 6–20)
CO2: 25 mmol/L (ref 22–32)
Calcium: 8.5 mg/dL — ABNORMAL LOW (ref 8.9–10.3)
Chloride: 106 mmol/L (ref 98–111)
Creatinine, Ser: 0.62 mg/dL (ref 0.44–1.00)
GFR, Estimated: >60 mL/min
Glucose, Bld: 78 mg/dL (ref 70–99)
Potassium: 3.9 mmol/L (ref 3.5–5.1)
Sodium: 139 mmol/L (ref 135–145)

## 2024-06-08 LAB — CBG MONITORING, ED: Glucose-Capillary: 81 mg/dL (ref 70–99)

## 2024-06-08 LAB — ACETAMINOPHEN LEVEL: Acetaminophen (Tylenol), Serum: <10 ug/mL — ABNORMAL LOW (ref 10–30)

## 2024-06-08 LAB — ETHANOL: Alcohol, Ethyl (B): <15 mg/dL

## 2024-06-08 LAB — SALICYLATE LEVEL: Salicylate Lvl: 21.7 mg/dL (ref 7.0–30.0)

## 2024-06-08 MED ORDER — AMOXICILLIN 500 MG PO CAPS: 500.0000 mg | ORAL_CAPSULE | Freq: Three times a day (TID) | ORAL | 0 refills | Status: AC

## 2024-06-08 MED ORDER — AMOXICILLIN 500 MG PO CAPS: 500.0000 mg | ORAL_CAPSULE | Freq: Three times a day (TID) | ORAL | 0 refills | Status: DC

## 2024-06-08 NOTE — Discharge Instructions (Addendum)
 Please take the occasions only as directed on the label or as prescribed by physician.  Please return for new or worsening symptoms.

## 2024-06-12 LAB — VITAMIN K1, SERUM: VITAMIN K1: 1.33 ng/mL (ref 0.10–2.20)

## 2024-07-16 ENCOUNTER — Inpatient Hospital Stay: Admitting: Hematology and Oncology

## 2024-07-16 ENCOUNTER — Inpatient Hospital Stay
